# Patient Record
Sex: Female | Born: 1975 | Race: Black or African American | Hispanic: No | Marital: Married | Smoking: Never smoker
Health system: Southern US, Community
[De-identification: ages and names within clinical notes are randomized; demographics above are authoritative.]

## PROBLEM LIST (undated history)

## (undated) ENCOUNTER — Inpatient Hospital Stay (HOSPITAL_COMMUNITY): Payer: Self-pay

## (undated) DIAGNOSIS — O10919 Unspecified pre-existing hypertension complicating pregnancy, unspecified trimester: Secondary | ICD-10-CM

## (undated) DIAGNOSIS — G35 Multiple sclerosis: Secondary | ICD-10-CM

## (undated) DIAGNOSIS — I10 Essential (primary) hypertension: Secondary | ICD-10-CM

## (undated) DIAGNOSIS — E669 Obesity, unspecified: Secondary | ICD-10-CM

## (undated) HISTORY — DX: Multiple sclerosis: G35

## (undated) HISTORY — DX: Unspecified pre-existing hypertension complicating pregnancy, unspecified trimester: O10.919

## (undated) HISTORY — PX: OTHER SURGICAL HISTORY: SHX169

## (undated) HISTORY — PX: DILATION AND CURETTAGE OF UTERUS: SHX78

## (undated) HISTORY — PX: SALPINGECTOMY: SHX328

## (undated) HISTORY — DX: Obesity, unspecified: E66.9

## (undated) HISTORY — PX: CHOLECYSTECTOMY: SHX55

## (undated) HISTORY — DX: Essential (primary) hypertension: I10

---

## 2001-07-12 ENCOUNTER — Other Ambulatory Visit: Admission: RE | Admit: 2001-07-12 | Discharge: 2001-07-12 | Payer: Self-pay | Admitting: *Deleted

## 2002-04-07 ENCOUNTER — Emergency Department (HOSPITAL_COMMUNITY): Admission: EM | Admit: 2002-04-07 | Discharge: 2002-04-07 | Payer: Self-pay | Admitting: Emergency Medicine

## 2002-05-27 ENCOUNTER — Inpatient Hospital Stay (HOSPITAL_COMMUNITY): Admission: AD | Admit: 2002-05-27 | Discharge: 2002-05-27 | Payer: Self-pay | Admitting: *Deleted

## 2002-05-27 ENCOUNTER — Encounter: Payer: Self-pay | Admitting: *Deleted

## 2002-06-02 ENCOUNTER — Encounter: Payer: Self-pay | Admitting: *Deleted

## 2002-06-02 ENCOUNTER — Encounter (INDEPENDENT_AMBULATORY_CARE_PROVIDER_SITE_OTHER): Payer: Self-pay

## 2002-06-02 ENCOUNTER — Ambulatory Visit (HOSPITAL_COMMUNITY): Admission: RE | Admit: 2002-06-02 | Discharge: 2002-06-02 | Payer: Self-pay | Admitting: *Deleted

## 2002-11-02 ENCOUNTER — Other Ambulatory Visit: Admission: RE | Admit: 2002-11-02 | Discharge: 2002-11-02 | Payer: Self-pay | Admitting: *Deleted

## 2003-04-23 ENCOUNTER — Inpatient Hospital Stay (HOSPITAL_COMMUNITY): Admission: AD | Admit: 2003-04-23 | Discharge: 2003-04-23 | Payer: Self-pay | Admitting: *Deleted

## 2003-05-08 ENCOUNTER — Encounter: Admission: RE | Admit: 2003-05-08 | Discharge: 2003-05-08 | Payer: Self-pay | Admitting: *Deleted

## 2003-05-15 ENCOUNTER — Encounter: Admission: RE | Admit: 2003-05-15 | Discharge: 2003-05-15 | Payer: Self-pay | Admitting: *Deleted

## 2003-05-18 ENCOUNTER — Inpatient Hospital Stay (HOSPITAL_COMMUNITY): Admission: AD | Admit: 2003-05-18 | Discharge: 2003-05-18 | Payer: Self-pay | Admitting: *Deleted

## 2003-05-22 ENCOUNTER — Encounter: Admission: RE | Admit: 2003-05-22 | Discharge: 2003-05-22 | Payer: Self-pay | Admitting: *Deleted

## 2003-05-28 ENCOUNTER — Encounter: Admission: RE | Admit: 2003-05-28 | Discharge: 2003-05-28 | Payer: Self-pay | Admitting: *Deleted

## 2003-05-31 ENCOUNTER — Inpatient Hospital Stay (HOSPITAL_COMMUNITY): Admission: AD | Admit: 2003-05-31 | Discharge: 2003-05-31 | Payer: Self-pay | Admitting: *Deleted

## 2003-05-31 ENCOUNTER — Inpatient Hospital Stay (HOSPITAL_COMMUNITY): Admission: AD | Admit: 2003-05-31 | Discharge: 2003-06-03 | Payer: Self-pay | Admitting: *Deleted

## 2003-07-01 ENCOUNTER — Emergency Department (HOSPITAL_COMMUNITY): Admission: EM | Admit: 2003-07-01 | Discharge: 2003-07-01 | Payer: Self-pay | Admitting: Emergency Medicine

## 2003-07-15 ENCOUNTER — Encounter (INDEPENDENT_AMBULATORY_CARE_PROVIDER_SITE_OTHER): Payer: Self-pay | Admitting: *Deleted

## 2003-07-22 ENCOUNTER — Emergency Department (HOSPITAL_COMMUNITY): Admission: EM | Admit: 2003-07-22 | Discharge: 2003-07-22 | Payer: Self-pay | Admitting: Emergency Medicine

## 2003-07-22 ENCOUNTER — Encounter: Payer: Self-pay | Admitting: *Deleted

## 2003-07-25 ENCOUNTER — Encounter: Admission: RE | Admit: 2003-07-25 | Discharge: 2003-07-25 | Payer: Self-pay | Admitting: Family Medicine

## 2003-08-13 ENCOUNTER — Encounter (INDEPENDENT_AMBULATORY_CARE_PROVIDER_SITE_OTHER): Payer: Self-pay | Admitting: *Deleted

## 2003-08-13 ENCOUNTER — Encounter: Payer: Self-pay | Admitting: Surgery

## 2003-08-13 ENCOUNTER — Inpatient Hospital Stay (HOSPITAL_COMMUNITY): Admission: EM | Admit: 2003-08-13 | Discharge: 2003-08-15 | Payer: Self-pay | Admitting: Emergency Medicine

## 2003-08-14 ENCOUNTER — Encounter: Payer: Self-pay | Admitting: Surgery

## 2003-11-19 ENCOUNTER — Ambulatory Visit (HOSPITAL_COMMUNITY): Admission: RE | Admit: 2003-11-19 | Discharge: 2003-11-19 | Payer: Self-pay | Admitting: Obstetrics & Gynecology

## 2003-11-22 ENCOUNTER — Encounter: Admission: RE | Admit: 2003-11-22 | Discharge: 2003-11-22 | Payer: Self-pay | Admitting: Family Medicine

## 2004-02-04 ENCOUNTER — Ambulatory Visit (HOSPITAL_COMMUNITY): Admission: RE | Admit: 2004-02-04 | Discharge: 2004-02-04 | Payer: Self-pay | Admitting: Family Medicine

## 2004-02-04 ENCOUNTER — Encounter: Admission: RE | Admit: 2004-02-04 | Discharge: 2004-02-04 | Payer: Self-pay | Admitting: Family Medicine

## 2004-02-18 ENCOUNTER — Encounter: Admission: RE | Admit: 2004-02-18 | Discharge: 2004-02-18 | Payer: Self-pay | Admitting: Family Medicine

## 2004-03-17 ENCOUNTER — Encounter: Admission: RE | Admit: 2004-03-17 | Discharge: 2004-03-17 | Payer: Self-pay | Admitting: Family Medicine

## 2004-07-22 ENCOUNTER — Other Ambulatory Visit: Payer: Self-pay

## 2004-10-02 ENCOUNTER — Inpatient Hospital Stay (HOSPITAL_COMMUNITY): Admission: AD | Admit: 2004-10-02 | Discharge: 2004-10-04 | Payer: Self-pay | Admitting: Obstetrics

## 2005-03-05 ENCOUNTER — Inpatient Hospital Stay (HOSPITAL_COMMUNITY): Admission: AD | Admit: 2005-03-05 | Discharge: 2005-03-20 | Payer: Self-pay | Admitting: Obstetrics

## 2005-03-26 ENCOUNTER — Ambulatory Visit (HOSPITAL_COMMUNITY): Admission: RE | Admit: 2005-03-26 | Discharge: 2005-03-26 | Payer: Self-pay | Admitting: Obstetrics & Gynecology

## 2005-03-30 ENCOUNTER — Ambulatory Visit (HOSPITAL_COMMUNITY): Admission: RE | Admit: 2005-03-30 | Discharge: 2005-03-30 | Payer: Self-pay | Admitting: Obstetrics & Gynecology

## 2005-03-31 ENCOUNTER — Inpatient Hospital Stay (HOSPITAL_COMMUNITY): Admission: AD | Admit: 2005-03-31 | Discharge: 2005-04-06 | Payer: Self-pay | Admitting: Obstetrics

## 2005-04-02 ENCOUNTER — Encounter (INDEPENDENT_AMBULATORY_CARE_PROVIDER_SITE_OTHER): Payer: Self-pay | Admitting: *Deleted

## 2005-09-16 ENCOUNTER — Ambulatory Visit: Payer: Self-pay | Admitting: Family Medicine

## 2005-10-16 ENCOUNTER — Ambulatory Visit: Payer: Self-pay | Admitting: Family Medicine

## 2005-11-09 ENCOUNTER — Ambulatory Visit: Payer: Self-pay | Admitting: Family Medicine

## 2005-11-13 ENCOUNTER — Ambulatory Visit: Payer: Self-pay | Admitting: Family Medicine

## 2005-12-17 ENCOUNTER — Ambulatory Visit: Payer: Self-pay | Admitting: Family Medicine

## 2005-12-22 ENCOUNTER — Ambulatory Visit: Payer: Self-pay | Admitting: Family Medicine

## 2006-01-19 ENCOUNTER — Ambulatory Visit: Payer: Self-pay | Admitting: Family Medicine

## 2006-02-16 ENCOUNTER — Ambulatory Visit: Payer: Self-pay | Admitting: Family Medicine

## 2006-03-30 ENCOUNTER — Ambulatory Visit: Payer: Self-pay | Admitting: Family Medicine

## 2006-08-04 ENCOUNTER — Ambulatory Visit: Payer: Self-pay | Admitting: Family Medicine

## 2006-09-03 ENCOUNTER — Ambulatory Visit: Payer: Self-pay | Admitting: Family Medicine

## 2006-10-27 ENCOUNTER — Ambulatory Visit: Payer: Self-pay | Admitting: Family Medicine

## 2007-01-29 ENCOUNTER — Ambulatory Visit: Payer: Self-pay | Admitting: Family Medicine

## 2007-02-11 ENCOUNTER — Encounter (INDEPENDENT_AMBULATORY_CARE_PROVIDER_SITE_OTHER): Payer: Self-pay | Admitting: *Deleted

## 2007-03-10 DIAGNOSIS — I10 Essential (primary) hypertension: Secondary | ICD-10-CM | POA: Insufficient documentation

## 2007-10-15 ENCOUNTER — Ambulatory Visit: Payer: Self-pay | Admitting: Family Medicine

## 2007-10-15 ENCOUNTER — Encounter (INDEPENDENT_AMBULATORY_CARE_PROVIDER_SITE_OTHER): Payer: Self-pay | Admitting: Internal Medicine

## 2007-10-25 ENCOUNTER — Ambulatory Visit: Payer: Self-pay | Admitting: Family Medicine

## 2007-10-25 DIAGNOSIS — E669 Obesity, unspecified: Secondary | ICD-10-CM

## 2007-10-25 DIAGNOSIS — R21 Rash and other nonspecific skin eruption: Secondary | ICD-10-CM | POA: Insufficient documentation

## 2007-10-25 DIAGNOSIS — I891 Lymphangitis: Secondary | ICD-10-CM

## 2008-05-04 ENCOUNTER — Ambulatory Visit: Payer: Self-pay | Admitting: Family Medicine

## 2008-05-18 ENCOUNTER — Ambulatory Visit: Payer: Self-pay | Admitting: Family Medicine

## 2008-05-22 LAB — CONVERTED CEMR LAB
ALT: 10 units/L (ref 0–35)
Albumin: 3.9 g/dL (ref 3.5–5.2)
Alkaline Phosphatase: 62 units/L (ref 39–117)
BUN: 9 mg/dL (ref 6–23)
Bilirubin, Direct: 0.1 mg/dL (ref 0.0–0.3)
CO2: 31 meq/L (ref 19–32)
Calcium: 9.1 mg/dL (ref 8.4–10.5)
Eosinophils Relative: 1.5 % (ref 0.0–5.0)
GFR calc Af Amer: 83 mL/min
Glucose, Bld: 103 mg/dL — ABNORMAL HIGH (ref 70–99)
HCT: 36.6 % (ref 36.0–46.0)
Hemoglobin: 13 g/dL (ref 12.0–15.0)
LDL Cholesterol: 111 mg/dL — ABNORMAL HIGH (ref 0–99)
Lymphocytes Relative: 25.3 % (ref 12.0–46.0)
Monocytes Absolute: 0.4 10*3/uL (ref 0.1–1.0)
Monocytes Relative: 6.5 % (ref 3.0–12.0)
Neutro Abs: 4.3 10*3/uL (ref 1.4–7.7)
RBC: 3.84 M/uL — ABNORMAL LOW (ref 3.87–5.11)
Sodium: 137 meq/L (ref 135–145)
Total CHOL/HDL Ratio: 5
Total Protein: 7.4 g/dL (ref 6.0–8.3)
WBC: 6.4 10*3/uL (ref 4.5–10.5)

## 2008-06-20 ENCOUNTER — Telehealth (INDEPENDENT_AMBULATORY_CARE_PROVIDER_SITE_OTHER): Payer: Self-pay | Admitting: Internal Medicine

## 2008-09-11 ENCOUNTER — Encounter (INDEPENDENT_AMBULATORY_CARE_PROVIDER_SITE_OTHER): Payer: Self-pay | Admitting: *Deleted

## 2008-11-13 DIAGNOSIS — G35 Multiple sclerosis: Secondary | ICD-10-CM

## 2008-11-17 ENCOUNTER — Ambulatory Visit: Payer: Self-pay | Admitting: Internal Medicine

## 2008-11-17 DIAGNOSIS — F809 Developmental disorder of speech and language, unspecified: Secondary | ICD-10-CM

## 2008-11-18 ENCOUNTER — Encounter (INDEPENDENT_AMBULATORY_CARE_PROVIDER_SITE_OTHER): Payer: Self-pay | Admitting: Internal Medicine

## 2008-11-18 ENCOUNTER — Observation Stay (HOSPITAL_COMMUNITY): Admission: EM | Admit: 2008-11-18 | Discharge: 2008-11-19 | Payer: Self-pay | Admitting: Emergency Medicine

## 2008-11-18 HISTORY — PX: MRI: SHX5353

## 2008-11-19 ENCOUNTER — Encounter (INDEPENDENT_AMBULATORY_CARE_PROVIDER_SITE_OTHER): Payer: Self-pay | Admitting: Internal Medicine

## 2008-11-23 ENCOUNTER — Ambulatory Visit: Payer: Self-pay | Admitting: Family Medicine

## 2008-11-27 ENCOUNTER — Ambulatory Visit (HOSPITAL_COMMUNITY): Admission: RE | Admit: 2008-11-27 | Discharge: 2008-11-27 | Payer: Self-pay | Admitting: Neurology

## 2008-11-27 HISTORY — PX: OTHER SURGICAL HISTORY: SHX169

## 2008-12-04 ENCOUNTER — Encounter (INDEPENDENT_AMBULATORY_CARE_PROVIDER_SITE_OTHER): Payer: Self-pay | Admitting: Internal Medicine

## 2009-01-17 ENCOUNTER — Encounter (INDEPENDENT_AMBULATORY_CARE_PROVIDER_SITE_OTHER): Payer: Self-pay | Admitting: Internal Medicine

## 2009-03-07 ENCOUNTER — Telehealth (INDEPENDENT_AMBULATORY_CARE_PROVIDER_SITE_OTHER): Payer: Self-pay | Admitting: Internal Medicine

## 2009-05-07 ENCOUNTER — Ambulatory Visit: Payer: Self-pay | Admitting: Internal Medicine

## 2009-05-15 ENCOUNTER — Telehealth (INDEPENDENT_AMBULATORY_CARE_PROVIDER_SITE_OTHER): Payer: Self-pay | Admitting: Internal Medicine

## 2009-05-17 ENCOUNTER — Encounter: Payer: Self-pay | Admitting: Internal Medicine

## 2009-05-17 ENCOUNTER — Ambulatory Visit: Payer: Self-pay | Admitting: Internal Medicine

## 2009-05-17 DIAGNOSIS — E049 Nontoxic goiter, unspecified: Secondary | ICD-10-CM | POA: Insufficient documentation

## 2009-05-17 DIAGNOSIS — R002 Palpitations: Secondary | ICD-10-CM

## 2009-05-17 DIAGNOSIS — R079 Chest pain, unspecified: Secondary | ICD-10-CM

## 2009-05-21 ENCOUNTER — Encounter: Admission: RE | Admit: 2009-05-21 | Discharge: 2009-05-21 | Payer: Self-pay | Admitting: Internal Medicine

## 2009-05-22 LAB — CONVERTED CEMR LAB
Free T4: 0.83 ng/dL (ref 0.80–1.80)
TSH: 1.918 microintl units/mL (ref 0.350–4.500)

## 2009-05-30 ENCOUNTER — Ambulatory Visit: Payer: Self-pay

## 2009-05-30 ENCOUNTER — Ambulatory Visit: Payer: Self-pay | Admitting: Internal Medicine

## 2009-05-30 ENCOUNTER — Encounter: Payer: Self-pay | Admitting: Internal Medicine

## 2009-06-06 ENCOUNTER — Ambulatory Visit: Payer: Self-pay | Admitting: Family Medicine

## 2009-06-06 DIAGNOSIS — J029 Acute pharyngitis, unspecified: Secondary | ICD-10-CM

## 2009-06-07 ENCOUNTER — Telehealth: Payer: Self-pay | Admitting: Internal Medicine

## 2009-06-10 ENCOUNTER — Ambulatory Visit: Payer: Self-pay | Admitting: Family Medicine

## 2009-06-12 ENCOUNTER — Encounter: Payer: Self-pay | Admitting: Internal Medicine

## 2009-07-03 ENCOUNTER — Encounter (INDEPENDENT_AMBULATORY_CARE_PROVIDER_SITE_OTHER): Payer: Self-pay | Admitting: Internal Medicine

## 2009-12-17 ENCOUNTER — Telehealth (INDEPENDENT_AMBULATORY_CARE_PROVIDER_SITE_OTHER): Payer: Self-pay | Admitting: Internal Medicine

## 2009-12-18 ENCOUNTER — Encounter (INDEPENDENT_AMBULATORY_CARE_PROVIDER_SITE_OTHER): Payer: Self-pay | Admitting: Internal Medicine

## 2009-12-18 ENCOUNTER — Ambulatory Visit: Payer: Self-pay | Admitting: Family Medicine

## 2009-12-18 DIAGNOSIS — R071 Chest pain on breathing: Secondary | ICD-10-CM

## 2010-03-18 LAB — CONVERTED CEMR LAB: Pap Smear: NORMAL

## 2010-03-20 ENCOUNTER — Encounter: Payer: Self-pay | Admitting: Family Medicine

## 2010-03-24 ENCOUNTER — Ambulatory Visit: Payer: Self-pay | Admitting: Family Medicine

## 2010-03-24 ENCOUNTER — Telehealth: Payer: Self-pay | Admitting: Family Medicine

## 2010-03-24 DIAGNOSIS — R5381 Other malaise: Secondary | ICD-10-CM

## 2010-03-24 DIAGNOSIS — R5383 Other fatigue: Secondary | ICD-10-CM

## 2010-03-24 DIAGNOSIS — M25579 Pain in unspecified ankle and joints of unspecified foot: Secondary | ICD-10-CM

## 2010-03-24 LAB — CONVERTED CEMR LAB
ALT: 14 units/L (ref 0–35)
Albumin: 3.9 g/dL (ref 3.5–5.2)
Basophils Absolute: 0 10*3/uL (ref 0.0–0.1)
Basophils Relative: 0.3 % (ref 0.0–3.0)
Bilirubin, Direct: 0.1 mg/dL (ref 0.0–0.3)
CO2: 28 meq/L (ref 19–32)
Chloride: 106 meq/L (ref 96–112)
Cholesterol: 167 mg/dL (ref 0–200)
Eosinophils Absolute: 0.1 10*3/uL (ref 0.0–0.7)
Glucose, Bld: 109 mg/dL — ABNORMAL HIGH (ref 70–99)
Hemoglobin: 12.8 g/dL (ref 12.0–15.0)
Lymphs Abs: 1.7 10*3/uL (ref 0.7–4.0)
MCHC: 34.8 g/dL (ref 30.0–36.0)
MCV: 97 fL (ref 78.0–100.0)
Monocytes Absolute: 0.5 10*3/uL (ref 0.1–1.0)
Neutro Abs: 5.7 10*3/uL (ref 1.4–7.7)
RBC: 3.77 M/uL — ABNORMAL LOW (ref 3.87–5.11)
RDW: 12.3 % (ref 11.5–14.6)
Sodium: 141 meq/L (ref 135–145)
Total CHOL/HDL Ratio: 5
Total Protein: 7.4 g/dL (ref 6.0–8.3)
Triglycerides: 65 mg/dL (ref 0.0–149.0)

## 2010-03-27 ENCOUNTER — Encounter: Admission: RE | Admit: 2010-03-27 | Discharge: 2010-03-27 | Payer: Self-pay | Admitting: Family Medicine

## 2010-03-27 ENCOUNTER — Telehealth: Payer: Self-pay | Admitting: Family Medicine

## 2010-03-30 ENCOUNTER — Emergency Department (HOSPITAL_COMMUNITY): Admission: EM | Admit: 2010-03-30 | Discharge: 2010-03-30 | Payer: Self-pay | Admitting: Emergency Medicine

## 2010-05-26 ENCOUNTER — Ambulatory Visit: Payer: Self-pay | Admitting: Family Medicine

## 2010-05-26 DIAGNOSIS — J069 Acute upper respiratory infection, unspecified: Secondary | ICD-10-CM | POA: Insufficient documentation

## 2010-05-26 DIAGNOSIS — R300 Dysuria: Secondary | ICD-10-CM

## 2010-05-26 LAB — CONVERTED CEMR LAB
Blood in Urine, dipstick: NEGATIVE
Ketones, urine, test strip: NEGATIVE
Nitrite: NEGATIVE
Specific Gravity, Urine: <1.005

## 2010-05-27 ENCOUNTER — Encounter: Payer: Self-pay | Admitting: Family Medicine

## 2010-11-10 ENCOUNTER — Ambulatory Visit (HOSPITAL_COMMUNITY)
Admission: RE | Admit: 2010-11-10 | Discharge: 2010-11-10 | Payer: Self-pay | Source: Home / Self Care | Admitting: Obstetrics

## 2010-11-21 ENCOUNTER — Ambulatory Visit (HOSPITAL_COMMUNITY)
Admission: AD | Admit: 2010-11-21 | Discharge: 2010-11-22 | Payer: Self-pay | Source: Home / Self Care | Attending: Obstetrics & Gynecology | Admitting: Obstetrics & Gynecology

## 2010-11-22 ENCOUNTER — Encounter: Payer: Self-pay | Admitting: Obstetrics & Gynecology

## 2011-01-13 NOTE — Progress Notes (Signed)
Summary: ankle pain  Phone Note Call from Patient Call back at Work Phone [phone] Call back at [phone]   Caller: Patient Call For: Dr Dayton Martes Summary of Call: Pt was seen today, mentioned a problem with her ankle, but it was ok today.  Now ankle is swollen and hurting.  She has had a problem with this off and for 2 weeks.  Taking advil - two at a time as needed, usually once a day a few times a week.  Using ice, wrapping it when it swells. Anything else she can do?  She doesnt want to come back in, if she can help it, since she was just here today. Initial call taken by: Lowella Petties CMA,  March 24, 2010 4:12 PM  Follow-up for Phone Call        If she does not want want to come in, she can take Ibuprofen 800mg  three times a day.  We can send her for xray if she is interested. Follow-up by: Ruthe Mannan MD,  March 24, 2010 4:32 PM  Additional Follow-up for Phone Call Additional follow up Details #1::        Patient Advised.   Patient is willing to do x-ray. Delilah Shan CMA (AAMA)  March 24, 2010 4:44 PM   New Problems: ANKLE PAIN, RIGHT (ICD-719.47)   New Problems: ANKLE PAIN, RIGHT (ICD-719.47)

## 2011-01-13 NOTE — Progress Notes (Signed)
Summary: pt doesnt want ortho referral  Phone Note Call from Patient   Caller: Patient Summary of Call: Pt was to have been referred to [facility] for ankle pain but she says not to worry about it.  She says she will figure something out on her own.  She says she cant keep taking off time from work  to go to doctors appts.  I offered her appt here with Dr. Patsy Lager but she declined, she said she will call back if she changes her mind. Initial call taken by: Lowella Petties CMA,  March 27, 2010 12:22 PM

## 2011-01-13 NOTE — Letter (Signed)
Summary: [facility]  [facility]   Imported By: Lanelle Bal 03/25/2010 13:53:54  _____________________________________________________________________  External Attachment:    Type:   Image     Comment:   External Document

## 2011-01-13 NOTE — Progress Notes (Signed)
Summary: Deep breath  Phone Note Call from Patient Call back at Home Phone [phone]   Caller: Patient Call For: Gildardo Griffes FNP Summary of Call: pt states she has been having trouble since Dec with taking deep breaths, she not having SOB but just when she takes a deep breath she is feeling discomfort. I scheduled pt appt tomorrow at 8:30am with you. I also advised pt that if she continues she should go to the ER. Initial call taken by: Mervin Hack CMA Duncan Dull),  December 17, 2009 9:46 AM  Follow-up for Phone Call        noted  Billie-Lynn Tyler Deis FNP  December 17, 2009 9:47 AM

## 2011-01-13 NOTE — Assessment & Plan Note (Signed)
Summary: CAN'T TAKE DEEP BREATH X1MTH /DS   Vital Signs:  Patient profile:   35 year old female Height:      64 inches Weight:      205.75 pounds BMI:     35.44 O2 Sat:      99 % on Room air Temp:     99 degrees F oral Pulse rate:   64 / minute Pulse rhythm:   regular Resp:     20 per minute BP sitting:   122 / 80  (left arm) Cuff size:   large  Vitals Entered By: Lewanda Rife LPN (December 18, 2009 8:51 AM)  O2 Flow:  Room air  Primary Care Provider:  Mcarthur Rossetti Timmy Cleverly FNP  CC:  can't take deep breath for one month.  History of Present Illness: Here as not able to get a deep breath x1 and 1/2 mo--called in yesterday afternoon ----has intermittent pain in L lower chest with deep breath or yawn --noted after helping husband with sling post op--he is tall, she had to stretch to get sling on --IBP 400 and Aleve 2 dont help  --no hx of difficulty breathing, has early MS  Allergies (verified): No Known Drug Allergies  Review of Systems      See HPI CV:  Complains of chest pain or discomfort; denies palpitations, swelling of feet, and swelling of hands. Resp:  See HPI; Complains of chest pain with inspiration; denies cough and wheezing. GI:  Denies nausea and vomiting. MS:  See HPI. Psych:  Complains of anxiety; denies depression; with current episode .  Physical Exam  General:  alert, well-developed, well-nourished, and well-hydrated.  NAD Neck:  no JVD and no carotid bruits.   Lungs:  normal respiratory effort, no intercostal retractions, no accessory muscle use, normal breath sounds, no crackles, and no wheezes.   Heart:  normal rate, regular rhythm, and no murmur.   Msk:  tender to light palp Lanterior chest wall, not over costal joints, no edema or eryth, increased discomfort with raising L arm against resistance Neurologic:  alert & oriented X3 and sensation intact to light touch.  gait slow initially, then pace increased, able to get on and off exam table  without assistance Skin:  see M/S Psych:  normally interactive, subdued, and slightly anxious.     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 One by mouth daily    Micardis Hct 80-25 Mg Tabs (Telmisartan-hctz) .Marland Kitchen... 1 once daily for bp  BP today: 122/80 Prior BP: 110/80 (06/10/2009)  Labs Reviewed: K+: 3.7 (05/18/2008) Creat: : 1.0 (05/18/2008)   Chol: 152 (05/18/2008)   HDL: 30.7 (05/18/2008)   LDL: 111 (05/18/2008)   TG: 52 (05/18/2008)  Complete Medication List: 1)  Norvasc 5 Mg Tabs (Amlodipine besylate) .... One by mouth daily 2)  Micardis Hct 80-25 Mg Tabs (Telmisartan-hctz) .Marland Kitchen.. 1 once daily for bp 3)  Avonex Prefilled 30 Mcg/0.46ml Kit (Interferon beta-1a) .... Injection once weekly for ms Prescriptions: NORVASC 5 MG  TABS (AMLODIPINE BESYLATE) one by mouth daily  #30 Tablet x 1   Entered by:   Lewanda Rife LPN   Authorized by:   Gildardo Griffes FNP   Signed by:   Lewanda Rife LPN on 16/09/9603   Method used:   Electronically to        [facility] [phone]* (retail)       [address]       [place], [place]  [address]       Ph: [phone]       Fax: [phone]   RxID:   [id number]   Current Allergies (reviewed today): No known allergies

## 2011-01-13 NOTE — Assessment & Plan Note (Signed)
Summary: follow up  billies patient/rbh   Vital Signs:  Patient profile:   35 year old female Height:      64 inches Weight:      207.38 pounds BMI:     35.73 Temp:     98.7 degrees F oral Pulse rate:   80 / minute Pulse rhythm:   regular BP sitting:   122 / 84  (left arm) Cuff size:   large  Vitals Entered By: Delilah Shan CMA Duncan Dull) (March 24, 2010 8:25 AM) CC: follow-up visit   History of Present Illness: 35 yo here to establish care with me (BDB patient).  h/o MS- diagnosed over a  year ago.  Has been taking Avonex weekly, which has greatly improved her symptoms.  Was having slurred speech, LE weakness and difficulty writing.  All that has resolved since she started taking it.  HTN- labile.  Well controlled now and Norvasc 5 mg daily and Micardis HCTZ 80-25.  PMH reviewed, appears she has been on numerous antihypertensives in past.  Had difficulty with both pregnancy because of labile HTN.  Contraception managment- currently has MIrena IUD but really wants to start trying to get pregnant sometime next year.  She is a Dentist.  Wants to loose 30 pounds before she gets pregant.  Recently started walking during her lunch hour.  Current Medications (verified): 1)  Norvasc 5 Mg Tabs (Amlodipine Besylate) .... One By Mouth Daily 2)  Micardis Hct 80-25 Mg Tabs (Telmisartan-Hctz) .Marland Kitchen.. 1 Once Daily For Bp 3)  Avonex Prefilled 30 Mcg/0.35ml Kit (Interferon Beta-1a) .... Injection Once Weekly For Ms  Allergies (verified): No Known Drug Allergies  Review of Systems      See HPI General:  Complains of fatigue; denies sweats, weakness, and weight loss. Eyes:  Denies blurring. ENT:  Denies difficulty swallowing. CV:  Denies chest pain or discomfort and shortness of breath with exertion. Resp:  Denies shortness of breath. GI:  Denies abdominal pain, bloody stools, and change in bowel habits. GU:  Denies dysuria. MS:  Denies joint pain, joint redness, and joint swelling. Derm:  Denies  rash. Neuro:  Denies headaches. Psych:  Denies anxiety and depression. Endo:  Denies cold intolerance.  Physical Exam  General:  alert, well-developed, well-nourished, and well-hydrated.  NAD Eyes:  No corneal or conjunctival inflammation noted. EOMI. Perrla. Funduscopic exam benign, without hemorrhages, exudates or papilledema. Vision grossly normal. Mouth:  Oral mucosa and oropharynx without lesions or exudates.  Teeth in good repair. Neck:  no JVD and no carotid bruits.   full thyroid, non tender. Lungs:  normal respiratory effort, no intercostal retractions, no accessory muscle use, normal breath sounds, no crackles, and no wheezes.   Heart:  normal rate, regular rhythm, and no murmur.   Abdomen:  Bowel sounds positive,abdomen soft and non-tender without masses, organomegaly or hernias noted. Extremities:  no edema either lower legs Neurologic:  alert & oriented X3 and strength normal in all extremities.   Psych:  normally interactive, subdued, and slightly anxious.     Impression & Recommendations:  Problem # 1:  GOITER, UNSPECIFIED (ICD-240.9) Assessment New Will check TSH today.  Problem # 2:  HYPERTENSION (ICD-401.9) Assessment: Unchanged Currently stable.  Discussed importance of changing classes of antihypertensive when she decides to start trying to get pregnant.  She will call me when she decides to have her IUD taken out and we will readdress at that time.  ?possible labetolol Her updated medication list for this problem includes:  Norvasc 5 Mg Tabs (Amlodipine besylate) ..... One by mouth daily    Micardis Hct 80-25 Mg Tabs (Telmisartan-hctz) .Marland Kitchen... 1 once daily for bp  Orders: Venipuncture (50093) TLB-BMP (Basic Metabolic Panel-BMET) (80048-METABOL)  Problem # 3:  FATIGUE (ICD-780.79) Assessment: New Likely due to lifestyle, full time job with 2 small children.  But given her goiter, wil check TSH, BMET and CBC. Orders: TLB-CBC Platelet - w/Differential  (85025-CBCD) TLB-TSH (Thyroid Stimulating Hormone) (84443-TSH)  Complete Medication List: 1)  Norvasc 5 Mg Tabs (Amlodipine besylate) .... One by mouth daily 2)  Micardis Hct 80-25 Mg Tabs (Telmisartan-hctz) .Marland Kitchen.. 1 once daily for bp 3)  Avonex Prefilled 30 Mcg/0.67ml Kit (Interferon beta-1a) .... Injection once weekly for ms  Other Orders: TLB-Hepatic/Liver Function Pnl (80076-HEPATIC) TLB-Lipid Panel (80061-LIPID)  Patient Instructions: 1)  It was so nice to meet you, Nesha. 2)  Good luck!!! 3)  Let me know when you start trying. 4)  We will call you with your lab results in the next day or two.  Current Allergies (reviewed today): No known allergies   Last PAP:  Done. (07/15/2003 12:00:00 AM) PAP Result Date:  03/18/2010 PAP Result:  normal PAP Next Due:  2 yr

## 2011-01-13 NOTE — Assessment & Plan Note (Signed)
Summary: URI/ 11:30   Vital Signs:  Patient profile:   35 year old female Height:      64 inches Weight:      206 pounds BMI:     35.49 Temp:     98.5 degrees F oral Pulse rate:   64 / minute Pulse rhythm:   regular BP sitting:   140 / 90  (left arm) Cuff size:   regular  Vitals Entered By: Linde Gillis CMA Duncan Dull) (May 26, 2010 11:47 AM) CC: ? URI, and ?UTI   History of Present Illness: 35 yo here for ?URI and ?UTI.  URI- started having cold symptoms last week.  Runny nose, cough, sore throat.  Cough has become productive over past few days.  No wheezing, SOB, CP or other symptoms.  Trying Delsym with no improvement of symptoms.  Feels like sinus pressure has worsened instead of getting better.  UTI- last two days, incrased urinary frequency, dysuria. No nausea, vomiting, fever, chills or back pain.  Current Medications (verified): 1)  Norvasc 5 Mg Tabs (Amlodipine Besylate) .... One By Mouth Daily 2)  Micardis Hct 80-25 Mg Tabs (Telmisartan-Hctz) .Marland Kitchen.. 1 Once Daily For Bp 3)  Avonex Prefilled 30 Mcg/0.93ml Kit (Interferon Beta-1a) .... Injection Once Weekly For Ms 4)  Bactrim Ds 800-160 Mg Tabs (Sulfamethoxazole-Trimethoprim) .Marland Kitchen.. 1 Tab By Mouth Two Times A Day X 5 Days  Allergies (verified): No Known Drug Allergies  Review of Systems      See HPI General:  Denies chills and fever. Resp:  Denies shortness of breath and wheezing. GI:  Denies abdominal pain, nausea, and vomiting.  Physical Exam  General:  alert, well-developed, well-nourished, and well-hydrated.  NAD Ears:  R ear normal and L ear normal.   Nose:  mucosal erythema.   Mouth:  Oral mucosa and oropharynx without lesions or exudates.  Teeth in good repair. Lungs:  normal respiratory effort, no intercostal retractions, no accessory muscle use, normal breath sounds, no crackles, and no wheezes.   Heart:  normal rate, regular rhythm, and no murmur.   Abdomen:  Bowel sounds positive,abdomen soft and non-tender  without masses, organomegaly or hernias noted. NO CVA tenderness Psych:  normally interactive, subdued, and slightly anxious.     Impression & Recommendations:  Problem # 1:  URI (ICD-465.9) Assessment New Likely viral.  Continue supportive care.  See pt instructions for details.  Also will be taking Bactrim for UTI, which has coverage for URI flora even  if it was bacterial.  Problem # 2:  DYSURIA (ICD-788.1) Assessment: New UA consistent with UTI.  Will treat with 5 day course of Bactrim, uncomplicated. Her updated medication list for this problem includes:    Bactrim Ds 800-160 Mg Tabs (Sulfamethoxazole-trimethoprim) .Marland Kitchen... 1 tab by mouth two times a day x 5 days  Orders: T-Culture, Urine ([id number]) UA Dipstick w/o Micro (manual) (46962) Specimen Handling (99000)  Complete Medication List: 1)  Norvasc 5 Mg Tabs (Amlodipine besylate) .... One by mouth daily 2)  Micardis Hct 80-25 Mg Tabs (Telmisartan-hctz) .Marland Kitchen.. 1 once daily for bp 3)  Avonex Prefilled 30 Mcg/0.76ml Kit (Interferon beta-1a) .... Injection once weekly for ms 4)  Bactrim Ds 800-160 Mg Tabs (Sulfamethoxazole-trimethoprim) .Marland Kitchen.. 1 tab by mouth two times a day x 5 days  Patient Instructions: 1)  Acute sinusitis symptoms for less than 10 days are not helped by antibiotics. Use warm moist compresses, and over the counter decongestants( only as directed). Call if no improvement in 5-7 days, sooner  if increasing pain, fever, or new symptoms.  2)  Drink plenty of fluids up to 3-4 quarts a day. Cranberry juice is especially recommended in addition to large amounts of water. Avoid caffeine & carbonated drinks, they tend to irritate the bladder, Return in 3-5 days if you're not better: sooner if you're feeling worse.  Prescriptions: BACTRIM DS 800-160 MG TABS (SULFAMETHOXAZOLE-TRIMETHOPRIM) 1 tab by mouth two times a day x 5 days  #10 x 0   Entered and Authorized by:   Ruthe Mannan MD   Signed by:   Ruthe Mannan MD on 05/26/2010    Method used:   Electronically to        [facility] [phone]* (retail)       [address]       [place], [place]  [address]       Ph: [phone]       Fax: [phone]   RxID:   [id number]   Current Allergies (reviewed today): No known allergies   Laboratory Results   Urine Tests  Date/Time Received: May 26, 2010 12:36 PM   Routine Urinalysis   Color: lt. yellow Appearance: Clear Glucose: negative   (Normal Range: Negative) Bilirubin: negative   (Normal Range: Negative) Ketone: negative   (Normal Range: Negative) Spec. Gravity: <1.005   (Normal Range: 1.003-1.035) Blood: negative   (Normal Range: Negative) pH: 7.5   (Normal Range: 5.0-8.0) Protein: trace   (Normal Range: Negative) Urobilinogen: 0.2   (Normal Range: 0-1) Nitrite: negative   (Normal Range: Negative) Leukocyte Esterace: moderate   (Normal Range: Negative)

## 2011-02-23 LAB — URINE MICROSCOPIC-ADD ON

## 2011-02-23 LAB — URINALYSIS, ROUTINE W REFLEX MICROSCOPIC
Hgb urine dipstick: NEGATIVE
Nitrite: NEGATIVE
Specific Gravity, Urine: 1.02 (ref 1.005–1.030)
Urobilinogen, UA: 1 mg/dL (ref 0.0–1.0)
pH: 6 (ref 5.0–8.0)

## 2011-02-23 LAB — CBC
HCT: 36.4 % (ref 36.0–46.0)
Hemoglobin: 12.6 g/dL (ref 12.0–15.0)
MCH: 33 pg (ref 26.0–34.0)
MCHC: 34.5 g/dL (ref 30.0–36.0)

## 2011-02-23 LAB — ABO/RH: ABO/RH(D): B POS

## 2011-02-23 LAB — HCG, QUANTITATIVE, PREGNANCY

## 2011-04-28 NOTE — Consult Note (Signed)
Angel Phelps, Angel Phelps            ACCOUNT NO.:  [id number]   MEDICAL RECORD NO.:  [id number]          PATIENT TYPE:  INP   LOCATION:  1407                         FACILITY:  [facility]   PHYSICIAN:  Casimiro Needle L. Reynolds, M.D.DATE OF BIRTH:  [birth year]   DATE OF CONSULTATION:  11/19/2008  DATE OF DISCHARGE:                                 CONSULTATION   CHIEF COMPLAINT:  Episodes of slurred speech.   HISTORY OF PRESENT ILLNESS:  This 35 year old African American female  with a two day history of intermittent slurred speech, oral numbness,  and left leg heaviness.  The patient states that these episodes occur  from seconds to minutes and have occurred multiple times.  The patient  first noticed these symptoms on Thursday, 12/03, when she was at work  and she was just finishing her lunch when she noted a tugging sensation  on the left perioral region along with a tingly sensation that did not  spread, stayed focally at the left perioral region.  The patient states  that occurred for approximately 20-30 minutes and then dissipated.  Approximately 3 hours later during the day she noticed that on the right  aspect of her mouth she had the same tingling sensation; however, this  time she noticed that she was talking to a fellow coworker and her  speech did not seem correct.  She described her  speech as slurred and  unable to articulate her words fully.  The patient went home that night  and had no other symptoms.  Friday morning woke up and noticed that her  left leg had become slightly heavy and achy in sensation for  approximately 20-30 seconds and then dissipated.  Throughout the day  that day she noticed that she again was having slurred and abnormal  enunciation of her speech which her coworkers objectively noted.  These  episodes occurred for seconds to minutes and then dissipated.  The  patient states there was no prodrome or aura that would have warned her  that it was occurring.  That  night the patient went home and had one  episode of numbness in the right aspect of the mouth that lasted seconds  and went to sleep.  Saturday morning woke up and noticed again some left  numbness and heaviness in her leg along with slurred speech while she  was saying prayer with her husband.  Her husband noticed the abnormality  in speech, became very concerned, and took her to the [facility] ED.  While in hospital the patient had an MRI which did show multiple white  matter lesions with pattern characteristic of multiple sclerosis.  Throughout the day on Sunday while she is in hospital, the patient kept  a log of how many times these episodes occurred.  She noted that at 9  a.m. she had a left lower leg heaviness and left foot numbness.  At 3:12  she again had symptoms.  At 3:56 again had symptoms.  At 4:20 p.m. noted  another episode of symptoms.  She states that during these episodes she  did have family members and friends in  the room that objectively noticed  the slurring of speech.  However, she stated there was no drooping or  abnormalities of her facial expression or musculature.  The patient  denies any visual disturbance or any decrease in or abnormal colors or  inability to discern colors.  No swallowing problems.  No bowel or  bladder problems.  No drooling.  She was able to [facility] and feed  herself without any difficulty.  She stated that she did not notice any  weakness or inability to walk, that her gait had never changed.  No  abnormalities in her writing or ability to read.   PAST MEDICAL HISTORY:  Positive for hypertension.   MEDICATIONS:  1. Amlodipine 5 mg daily.  2. Benicar 40 mg daily.  3. Hydrochlorothiazide 25 mg daily.  4. Aspirin 325 daily.   ALLERGIES:  None.   FAMILY HISTORY:  Mother died at age 34 of a heart attack.  Father has  heart disease and has had a coronary artery bypass.  He is 54 at this  time.  Father has also had hyperlipidemia and  diabetes.   SOCIAL HISTORY:  She does not smoke and does not drink.  Does not do  illicit drugs.  She is married and works a Ambulance person as an  Production designer, theatre/television/film.   REVIEW OF SYSTEMS:  With the exception of above, she is negative for all  12 review of systems.   PHYSICAL EXAM:  VITAL SIGNS:  Blood pressure 125/76.  Pulse 80,  respiratory rate 19, and temperature 97.8.  NEURO:  Mental status:  She is alert and oriented x3.  Carries out two  steps.  Has good enunciation, good sentence structure, and she is able  to communicate without any difficulties.  Pupils are equal, round, and  reactive to light, accommodating 3 mm and 2 mm bilaterally.  Conjugate  gaze.  Extraocular muscles are intact.  Visual fields intact.  Face  symmetrical.  Tongue midline.  Uvula midline.  No slurred speech or  facial droop.  Coordination, finger-to-nose and heel-to-shin within  normal limits.  Fine motor movements are within normal limits.  Gait is  narrow, smooth, and nonataxic.  Motor is 5/5 globally.  No tremor.  Good  bulk, good tone.  Deep tendon reflexes are 2+ globally in the upper extremities.  Patella  is 3+ bilaterally, and plantar reflex is downgoing bilaterally.  Drift:  She has negative drift bilaterally upper and lower extremities.  Sensation is full to pinprick and light touch.  PULMONARY:  Clear to auscultation bilaterally.  CARDIOVASCULAR:  S1, S2.  Regular rate and rhythm without murmurs,  gallops or rubs.  ABDOMEN:  Normal bowel sounds, soft, nontender, nondistended.  GU AND RECTAL:  Deferred.  EXTREMITIES:  No clubbing, cyanosis or edema.  NECK:  Negative bruits and is supple.  PSYCHIATRIC:  Normal affect.  Calm and cooperative.   LABS:  At this time, homocysteine 10.7.  UA is positive for leukocytes  and few bacteria along with a few epithelial cells.  Sodium 136,  potassium 3.1, chloride 100, bicarb 29, BUN 9, creatinine is 0.74,  glucose 110, white blood cells 9.5.  Hemoglobin and  hematocrit 13 and  39.2.  Platelets 367, triglycerides 67, cholesterol 160, HDL 28, LDL  119.  Urine drug screen is negative.   IMAGING AND TESTS:  CT of the head was negative.  MRI showed multiple  white matter lesions with pattern characteristic of MS.  MRA of the head  was negative.   ASSESSMENT:  Assessment at this time is a 35 year old African American  female with new onset perioral tingling, abnormal sensation and  heaviness of her left leg.  The symptoms are intermittent and quick in  nature.  MRI shows white matter lesions characteristic of multiple  sclerosis.  With signs and symptoms and MRI findings, the  patient has a high probability diagnostic of multiple sclerosis.  Treatment at this time can be carried out as an outpatient with steroids  and disease modifying agents.  I will discuss this with Dr. Thad Ranger.  Thank you very much for the consultation.     ______________________________  Felicie Morn, PA-C      Marolyn Hammock. Thad Ranger, M.D.  Electronically Signed    DS/MEDQ  D:  11/19/2008  T:  11/19/2008  Job:  [id number]   cc:   Vikki Ports A. Felicity Coyer, MD  [address] [place], [place] [address]   Dr. Thad Ranger

## 2011-04-28 NOTE — H&P (Signed)
NAME:  Angel, Phelps            ACCOUNT NO.:  [id number]   MEDICAL RECORD NO.:  [id number]          PATIENT TYPE:  EMS   LOCATION:  ED                           FACILITY:  [facility]   PHYSICIAN:  Corinna L. Lendell Caprice, MDDATE OF BIRTH:  [birth year]   DATE OF ADMISSION:  11/17/2008  DATE OF DISCHARGE:                              HISTORY & PHYSICAL   CHIEF COMPLAINT:  Slurred speech.   HISTORY OF PRESENT ILLNESS:  Angel Phelps is a 35 year old black female  patient of Dr. Hetty Ely who presents with recurrent episodes of slurred  speech.  This started 2 days ago and usually lasts for a short period of  time, seconds to minutes.  But they  have been recurrent in nature.  The  patient noticed it herself several days ago and thought it might be an  allergic reaction to a paper towel she had used. Her husband noticed it  yesterday and again while on the phone several times.  The patient  reports that she also has right leg numbness. She has no other symptoms.  She has been compliant with her antihypertensives.  She has never had a  similar episode.   PAST MEDICAL HISTORY:  Hypertension.   MEDICATIONS:  Amlodipine 5 mg a day, Benicar 40/25 mg a day.   ALLERGIES:  No known drug allergies.   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  She does  not smoke, drink or use drugs.  She is married.   FAMILY HISTORY:  Her mother died at age 62 of a heart attack.  Her  father has heart disease and has had coronary artery bypass surgery.  He  is 54.  He also has hyperlipidemia and diabetes.   REVIEW OF SYSTEMS:  As above. Otherwise negative.   PHYSICAL EXAMINATION:  VITAL SIGNS: Temperature is 98.2, blood pressure  131/79, pulse 86, respiratory rate 18, oxygen saturation 99% on room  air.  GENERAL:  The patient is well-nourished, well-developed in no acute  distress.  HEENT: Normocephalic, atraumatic.  Pupils equal, round, reactive to  light.  Sclerae nonicteric.  Moist mucous  membranes.  She has no  ulcerations of her tongue or buccal mucosa.  NECK: Supple.  No carotid bruits.  LUNGS: Clear to auscultation bilaterally without wheezes, rhonchi or  rales.  CARDIOVASCULAR:  Regular rate and rhythm without murmurs, gallops or  rubs.  ABDOMEN:  Normal bowel sounds, soft, nontender, nondistended.  GU/RECTAL:  Deferred.  EXTREMITIES: No clubbing, cyanosis or edema.  SKIN:  No rash.  NEUROLOGIC: She is alert and oriented x3.  Cranial nerves are intact.  Initially when I entered the room she seemed to have some slurring of  her speech which improved over about a minute or two. Motor strength 5/5  throughout.  Deep tendon reflexes are symmetric.  Pinprick sensation is  normal. Gait normal.  PSYCHIATRIC:  Normal affect. Calm and cooperative.   LABS:  CBC unremarkable. Complete metabolic panel significant for  potassium of 3.1. Otherwise unremarkable.  Urine pregnancy negative.  Urinalysis shows small leukocyte esterase, 3-6 white cells, few  bacteria, negative nitrites,  few squamous epithelial cells.  CT of the  brain is normal.   ASSESSMENT/PLAN:  1. Transient episodes of dysarthria with mild right leg numbness which      has been constant since Thursday.  The patient will be placed on 23-      hour observation.  I will get any EKG.  She will be on telemetry.      Give aspirin. Check an MRI. Neuro checks and check fasting lipids      and homocystine. Further workup to follow based on MRI.  May need      neurology input.  Also check urine drug screen.  2. Hypertension, controlled.   1. Hypokalemia.  This will be repleted.      Corinna L. Lendell Caprice, MD  Electronically Signed     CLS/MEDQ  D:  11/18/2008  T:  11/18/2008  Job:  [id number]   cc:   Arta Silence, MD  Fax: [phone]

## 2011-05-01 NOTE — Op Note (Signed)
NAME:  Angel Phelps, Angel Phelps                      ACCOUNT NO.:  [id number]   MEDICAL RECORD NO.:  [id number]                   PATIENT TYPE:  INP   LOCATION:  5734                                 FACILITY:  [facility]   PHYSICIAN:  Petra Kuba, M.D.                 DATE OF BIRTH:  [birth year]   DATE OF PROCEDURE:  08/14/2003  DATE OF DISCHARGE:                                 OPERATIVE REPORT   PROCEDURES:  1. Endoscopic retrograde cholangiopancreatography.  2. Sphincterotomy.  3. Stone extraction.   ENDOSCOPIST:  Petra Kuba, M.D.   INDICATIONS:  Abnormal intraoperative cholangiogram.  CBD stones.  Consent  was signed after the risks, benefits, methods, options were thoroughly  discussed with both the patient and her husband on multiple occasions.   PREMEDICATION:  1. Demerol 125.  2. Versed 10.   DESCRIPTION OF PROCEDURE:  The therapeutic video side-viewing duodenoscope  was inserted by indirect vision into the stomach, advanced through a normal  antrum and pylorus, and a normal-appearing ampulla was brought into view.  Using a triple-lumen sphincterotome, initially a normal pancreatogram was  obtained.  Thus, the sphincterotome was repositioned and using the Jagwire,  was able to get deep selective cannulation.  On initial injections, possibly  some tiny filling defects were seen.   We then proceeded with a moderate size sphincterotomy in the customary  fashion until we had good biliary drainage and were able to get a full width  sphincterotome in and out of the duct.  We went ahead and exchanged the  sphincterotome for the adjustable balloon and using the 8.5 setting, went  ahead and proceeded with three balloon pull-throughs over the Jagwire in the  customary fashion.  Tiny debris was removed but no significant stone  fragments.   We went ahead and proceeded with two occlusion cholangiograms just to make  sure there were no missed lesions which were both done in the  customary  fashion without problems.  There was no obvious remaining filling defects.  She did seem to drain adequately.  We were able to pull the 8.5 mm  adjustable balloon through the ampulla with very minimal resistance.  The  scope was removed at this junction.   The patient tolerated the procedure well.  There was no obvious immediate  complication.   ENDOSCOPIC DIAGNOSES:  1. Normal ampulla.  2. Normal double occlusion pressure and few injections.  3. Tiny filling defect in the common bile duct on early injections.  4. Normal intrahepatic ducts.  5. Status post moderate size sphincterotomy and multiple 8.5 balloon pull-     throughs with minimal tiny stones and debris being removed.  6. Normal occlusion cholangiogram x2 at the end of the procedure.   PLAN:  Observe for delayed complications.  If note, probably home tomorrow.  Okay to breast-feed possibly in three days.  May need to consult the  breastfeeding specialist to  make sure that is okay; specifically, if she is  off pain medicines by then.  Otherwise, would follow liver tests until they  normalize which could be rechecked as an outpatient.                                               Petra Kuba, M.D.    MEM/MEDQ  D:  08/14/2003  T:  08/14/2003  Job:  [id number]   cc:   Velora Heckler, M.D.  [address] [place]  [place] [address]  Fax: [phone]

## 2011-05-01 NOTE — Consult Note (Signed)
   NAME:  Angel Phelps, Angel Phelps NO.:  [id number]   MEDICAL RECORD NO.:  [id number]                   PATIENT TYPE:  INP   LOCATION:  5734                                 FACILITY:  [facility]   PHYSICIAN:  Petra Kuba, M.D.                 DATE OF BIRTH:  1976/10/22   DATE OF CONSULTATION:  DATE OF DISCHARGE:                                   CONSULTATION   REFERRING PHYSICIAN:  Velora Heckler, M.D.   REASON FOR CONSULTATION:  The patient was seen at the request of Dr. Velora Heckler for an abnormal intraoperative cholangiogram.  Not only does she have  CBD stones but there is a question of atypical connection with CBD, PD, and  duodenum, although she is doing fine postoperatively.   PAST MEDICAL HISTORY:  Pertinent for cholecystectomy.  Two months ago had a  child and has high blood pressure.   SOCIAL HISTORY:  Does not smoke, drink or do drugs.   FAMILY HISTORY:  Negative.   CURRENT MEDICATIONS:  Prilosec, birth control pills, and Toprol.   REVIEW OF SYMPTOMS:  Negative except above.   PHYSICAL EXAMINATION:  GENERAL:  The patient looks well, still a little  sleepy from her surgery.  VITAL SIGNS:  Stable and afebrile.  Not examined today prior to the ERCP.   LABORATORY DATA:  Ultrasound pertinent for normal CBD, multiple small  gallstones.  Labs please see chart.  Very minimally elevated SGOT but  otherwise normal.  She was in the ER earlier in the month and her SGOT was  146, SGPT 72 with a normal CBC except for a white count of 13.  Lipase was  normal.  Other labs are normal.   ASSESSMENT:  1. Common bile duct stones.  2. 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    PLAN:  The risks, benefits, and methods of the ERCP were discussed with both  the patient and her husband.  We talked about that versus an open common  duct exploration.  We also talked about possibly the anatomy causing some  problems in our success rate.  We will proceed tomorrow at 2:30.  Thank you  very much for the consult.                                               Petra Kuba, M.D.    MEM/MEDQ  D:  08/13/2003  T:  08/13/2003  Job:  [id number]   cc:   Velora Heckler, M.D.  [address] [place]  [place] [address]  Fax: [phone]

## 2011-05-01 NOTE — H&P (Signed)
NAME:  Angel Phelps, Angel Phelps                      ACCOUNT NO.:  [id number]   MEDICAL RECORD NO.:  [id number]                   PATIENT TYPE:  INP   LOCATION:  5705                                 FACILITY:  [facility]   PHYSICIAN:  Velora Heckler, M.D.                DATE OF BIRTH:  1976-08-08   DATE OF ADMISSION:  08/13/2003  DATE OF DISCHARGE:                                HISTORY & PHYSICAL   REASON FOR ADMISSION:  Cholelithiasis, biliary colic.   PRIMARY CARE PHYSICIAN:  [facility].   GYNECOLOGIST:  Georgina Peer, M.D.   HISTORY OF PRESENT ILLNESS:  The patient is a 35 year old black female who  presented to the [facility] Emergency Department for the second time in 3  weeks, with episode of biliary colic.  The patient had eaten some sugar  cookies around midnight, she was awakened from sleep at 2:00 a.m. with right  upper quadrant epigastric abdominal pain with nausea. She experienced cold  chills. She came to the emergency department where she was seen and  evaluated.  Pain has now resolved.  The patient is interested in proceeding  with cholecystectomy during this admission.  The patient notes intermittent  attacks for the past 6 weeks.  A previous ultrasound on July 22, 2003 at  [facility] demonstrated multiple small gallstones without any signs  or symptoms of acute cholecystitis.  The patient denied jaundice, acholic  stools. There is no family history of biliary disease.   PAST MEDICAL HISTORY:  Hypertension, status post D&C, history of childbirth  2 months ago with healthy baby boy.   MEDICATIONS:  1. Toprol  XL 50 mg daily.  2. Birth control pills.  3. Prilosec 20 mg daily for 3 weeks.   ALLERGIES:  NO KNOWN DRUG ALLERGIES.   SOCIAL HISTORY:  Patient is married.  Her husband works for [employer]. She has 1 child, age 34 months.  She does not smoke. She does  not drink alcohol.   FAMILY HISTORY:  Noncontributory.   REVIEW OF  SYSTEMS:  A 15 systems review without significant other positives,  except as noted above.   PHYSICAL EXAMINATION:  VITAL SIGNS:  Temperature 98.0, blood pressure  158/98, pulse 64, respirations 16, O2 saturation 98%.  GENERAL:  A 35-year-  old, bright, alert black female in no acute distress, in room 7 in the  emergency department.  HEENT:  Normocephalic, atraumatic.  Sclerae clear,  conjunctivae clear.  Pupils are equal and reactive. Dentition is good.  Voice is normal.  NECK: Supple without mass. Thyroid is normal without  nodularity. There is no anterior or posterior cervical adenopathy.  There  are no supraclavicular masses.  LUNGS:  Clear to auscultation without rales or rhonchi.  There is no costal  vertebral angle tenderness.  HEART:  Regular rate and rhythm without murmur.  Peripheral pulses are full.  ABDOMEN:  Soft  without distention. There are  multiple striae on the abdominal wall. There are bowel sounds present. There  is no palpable mass, there is no significant tenderness.  EXTREMITIES:  Nontender without edema.  NEUROLOGIC:  Patient is alert and oriented without  focal deficit.   LABORATORY DATA:  Complete blood count shows white count 6.4, hemoglobin  13.7, hematocrit 40.4%, platelet count 313,000. Differential normal.  Chemistry profile notable for potassium of 2.9 and SGOT of 43, total  bilirubin normal at 0.7, amylase normal at 104.  Pregnancy test is negative.  Urinalysis shows small hemoglobin and trace leukocyte esterase, although  patient is on her menstrual cycle.   IMPRESSION:  1. Biliary colic, recurrent.  2. Cholelithiasis.  3. Hypertension.  4. Hypokalemia.   PLAN:  1. Admission to [facility].  2. Preoperative preparation for cholecystectomy.  3. To operating room for laparoscopic cholecystectomy with intraoperative     cholangiogram.  4. Routine postoperative care.   I discussed at length with the patient the indications to proceed with   surgery. She has had at least 3 episodes of biliary colic within the last 6  weeks. She has had 2 episodes severe enough to bring her to the emergency  department.  She has seen Dr. Ovidio Kin in our office at [facility].  He is involved with complex cases at [facility] today  and is unable to see the patient. I have offered to admit her and proceed  with cholecystectomy.  Patient is in agreement.                                                Velora Heckler, M.D.    TMG/MEDQ  D:  08/13/2003  T:  08/13/2003  Job:  [id number]   cc:   [facility]   Georgina Peer, M.D.  [phone]  [place]  [place] [address]  Fax: [phone]

## 2011-05-01 NOTE — Op Note (Signed)
NAME:  Angel Phelps, YALE                      ACCOUNT NO.:  [id number]   MEDICAL RECORD NO.:  [id number]                   PATIENT TYPE:  INP   LOCATION:  5705                                 FACILITY:  [facility]   PHYSICIAN:  Velora Heckler, M.D.                DATE OF BIRTH:  15-Jan-1976   DATE OF PROCEDURE:  08/13/2003  DATE OF DISCHARGE:                                 OPERATIVE REPORT   PREOPERATIVE DIAGNOSIS:  Symptomatic cholelithiasis.   POSTOPERATIVE DIAGNOSIS:  Symptomatic cholelithiasis, choledocholithiasis.   PROCEDURES:  Laparoscopic cholecystectomy with interoperative  cholangiography.   SURGEON:  Velora Heckler, M.D.   ASSISTANT:  Jimmye Norman, M.D.   ANESTHESIA:  General.   ESTIMATED BLOOD LOSS:  Minimal.   PREPARATION:  Betadine.   COMPLICATIONS:  None.   INDICATIONS FOR PROCEDURE:  The patient is a 35 year old black female who  presents to the emergency department with her 3rd episode of biliary colic  in the past 6 weeks. The patient had been seen 3 weeks ago in the emergency  department. An ultrasound demonstrated multiple gallstones. She was seen in  the office by Dr. Ovidio Kin. At that time the patient deferred having  surgery. The patient had onset of epigastric  and right upper quadrant  abdominal pain approximately 2 a.m. this morning. She presented to the  emergency department for assessment. She agreed with proceeding with  cholecystectomy today.   FINDINGS:  Common bile duct stones noted on cholangiography. There is distal  obstruction. There is filling of the pancreatic ductal system with  retrograde flow of contrast through what appears to be a small  accessory  duct into the duodenum. The major ampulla is never identified.   DESCRIPTION OF PROCEDURE:  The procedure is done in operating room #15 at  the [facility]. The patient was brought to the  operating room and placed in the supine position on the operating table.  Following  the administration of general anesthesia the patient was prepped  and draped in the usual strict aseptic fashion.   After ascertaining that an adequate level of anesthesia had been obtained,  an infraumbilical incision was made with a #15 blade. Dissection was carried  down through the subcutaneous tissues to the fascia. The fascia is incised  in the midline  and the peritoneal cavity is entered cautiously.   A 0 Vicryl pursestring suture is placed in the fascia. An Hasson cannula is  introduced under direct vision and secured with a pursestring suture. The  abdomen is insufflated with CO2. The laparoscope was introduced and the  abdomen is explored.   Operative ports were placed along the right costal margin in the midline,  midclavicular line and the anterior axillary line. The fundus of the  gallbladder  is grasped and retracted  cephalad. Dissection is begun at the  neck  of the gallbladder. Small stones are visible within  the neck  of the  gallbladder. The cystic duct is dissected out along its length and a clip is  placed at the neck  of the gallbladder  to prevent stones from entering the  cystic duct.   The cystic duct is incised with the endo shears. Immediately removed are 2  small, yellow cholesterol stones which are quite soft. These measure a few  millimeters in diameter. Upon massaging the cystic duct further, soft,  yellow stonelike objects are extracted.   Next a Cook cholangiography catheter  is introduced through a stab wound in  the right upper quadrant. It is inserted into the cystic duct and secured  with a Ligaclip. Using C-arm fluoroscopy, real time cholangiography is  performed. There is flow through a small cystic duct into a mildly dilated  common bile duct. There is reflux of contrast into the right and left ductal  systems. There appears to be filling defects in the main common bile duct.  As contrast reaches the distal extent of the duct, the  duct appears to be  obstructed by multiple small filling defects. There is retrograde filling of  the pancreatic duct. There appears to be a small  accessory pancreatic duct  which fills and drains into the duodenum. No contrast goes through the major  ampulla into the duodenum. Representative  photographs of this study are  incorporated into the medical record.   The clip is withdrawn and the [facility] catheter is removed from the right upper  quadrant. The cystic duct is triply clipped and divided. The cystic artery  is dissected out, doubly clipped and divided. The posterior branch of the  cystic artery is doubly clipped and divided. The gallbladder  is excised  from the gallbladder bed using the hook electrocautery for hemostasis.   The gallbladder  is completely excised and placed into an EndoCatch bag  which is then removed through the umbilical port without difficulty. The  right upper quadrant was copious irrigated with warm saline and good  hemostasis is obtained in the gallbladder  bed with the hook electrocautery.  Saline is evacuated.   The ports were removed under direct vision.  Pneumoperitoneum is released.  Then 0 Vicryl pursestring sutures are tied securely. All port  sites were  anesthetized with local anesthetic. All sites were closed with interrupted 4-  0 Vicryl subcuticular sutures. The wounds are washed and dried and Benzoin  and Steri-Strips are applied. Sterile gauze dressings are applied.   The patient was awakened from anesthesia and transported to the recovery  room in stable condition. The patient tolerated the procedure well.                                               Velora Heckler, M.D.    TMG/MEDQ  D:  08/13/2003  T:  08/13/2003  Job:  [id number]   cc:   Georgina Peer, M.D.  [address]  [place]  [place] [address]  Fax: [phone]   [facility]

## 2011-05-01 NOTE — Discharge Summary (Signed)
NAMEMarland Phelps  Angel, Phelps            ACCOUNT NO.:  [id number]   MEDICAL RECORD NO.:  [id number]          PATIENT TYPE:  INP   LOCATION:  9306                          FACILITY:  [facility]   PHYSICIAN:  Roseanna Rainbow, M.D.DATE OF BIRTH:  18-Jul-1976   DATE OF ADMISSION:  10/02/2004  DATE OF DISCHARGE:  10/04/2004                                 DISCHARGE SUMMARY   CHIEF COMPLAINT:  The patient is a 35 year old with a history of chronic  hypertension, first trimester intrauterine pregnancy with severe elevations  of blood pressure.   HISTORY OF PRESENT ILLNESS:  The patient had presented to the office for  routine prenatal visit and was found to have elevated blood pressures in the  severe range.  She denied any concomitant complaints.   ALLERGIES:  No known drug allergies.   SOCIAL HISTORY:  She denies any tobacco, ethanol, or substance abuse.   PAST MEDICAL HISTORY:  Remarkable for chronic hypertension.   PAST OBSTETRICAL HISTORY:  She has a history of D&C.   PAST SURGICAL HISTORY:  She has a history of cholecystectomy.   PHYSICAL EXAMINATION:  VITAL SIGNS:  Blood pressure 140/80-100.  Temperature  98.8, pulse 98, respiratory rate 20.  GENERAL:  Well-developed, well-nourished, in no acute distress.  ABDOMEN:  Nontender.  PELVIC:  Deferred.  EXTREMITIES:  No cyanosis, clubbing, or edema.   ASSESSMENT:  First trimester pregnancy with severe elevated blood pressures.   PLAN:  Admission, blood pressure control.   HOSPITAL COURSE:  The patient was admitted and subsequently started on p.o.  labetalol.  Her blood pressure stabilized in the 140's over 80 range and she  was discharged to home on October 04, 2004.   DISCHARGE DIAGNOSES:  1.  Chronic hypertension.  2.  Early pregnancy, first trimester.   CONDITION ON DISCHARGE:  Stable.   DIET:  Low sodium.   MEDICATIONS:  Labetalol and Phenergan.   DISPOSITION:  The patient is to keep her previous appointment in the  office.  She was also counseled to follow up with an ophthalmologist for a  funduscopic examination.     Collier Flowers  D:  10/20/2004  T:  10/20/2004  Job:  [id number]

## 2011-05-01 NOTE — H&P (Signed)
NAMEMarland Kitchen  WINNI, EHRHARD NO.:  [id number]   MEDICAL RECORD NO.:  [id number]          PATIENT TYPE:  INP   LOCATION:  9172                          [facility]ACILITY:  [facility]   PHYSICIAN:  Roseanna Rainbow, M.D.DATE OF BIRTH:  May 21, 1976   DATE OF ADMISSION:  03/05/2005  DATE OF DISCHARGE:                                HISTORY & PHYSICAL   CHIEF COMPLAINT:  The patient is a 35 year old with an intrauterine  pregnancy at 44 plus weeks with an estimated date of confinement of May 30, 2005 with chronic hypertension, rule out superimposed preeclampsia.   HISTORY OF PRESENT ILLNESS:  The patient was known to have preexisting  hypertension prior to pregnancy. She has been on Norvasc and labetalol and  has been seen in consultation with the [facility] office. Her blood  pressures have become increasingly labile. At the office visit today  urinalysis was remarkable for proteinuria measuring 3 plus.  She denied any  concomitant symptoms consistent with severe preeclampsia.   ALLERGIES:  No known drug allergies.   SOCIAL HISTORY:  She denies any tobacco, ethanol or substance abuse.   PAST MEDICAL HISTORY:  Please see the above.   PAST OB/GYN HISTORY:  She has a history of a D&C.   PAST SURGICAL HISTORY:  She has a history of a cholecystectomy.   MEDICATIONS:  Labetalol and Norvasc prenatal vitamins.   PHYSICAL EXAMINATION:  VITAL SIGNS:  Blood pressure 159/88, temperature  97.4, pulse 83, weight 209 pounds.  GENERAL:  A well-developed, well-nourished, African-American female in no  apparent distress.  ABDOMEN:  Nontender.  PELVIC:  Deferred.  EXTREMITIES:  Minimal edema.   Fetal heart rate with the Doppler 140's.   ASSESSMENT:  Intrauterine pregnancy at 27 plus weeks with history of chronic  hypertension, rule out superimposed severe preeclampsia.   PLAN:  Admission serial labs.  Followup with the results from the 24 hour  urine and urine culture and sensitivity  performed in the office today.  Complete obstetrical ultrasound for growth, bed rest, serial weights,  possible inpatient consultation with maternal fetal medicine, possible  steroids to stimulate fetal lung maturity.      LAJ/MEDQ  D:  03/05/2005  T:  03/05/2005  Job:  [id number]

## 2011-05-01 NOTE — Op Note (Signed)
Angel Phelps, Angel Phelps            ACCOUNT NO.:  [id number]   MEDICAL RECORD NO.:  [id number]          PATIENT TYPE:  INP   LOCATION:  9371                          [facility]  [facility]   PHYSICIAN:  Charles A. Clearance Coots, M.D.DATE OF BIRTH:  11-14-1976   DATE OF PROCEDURE:  04/02/2005  DATE OF DISCHARGE:                                 OPERATIVE REPORT   PREOPERATIVE DIAGNOSES:  1.  [redacted] weeks gestation.  2.  Severe preeclampsia.  3.  Induction of labor.  4.  Late fetal heart rate decelerations.   POSTOPERATIVE DIAGNOSES:  1.  [redacted] weeks gestation.  2.  Severe preeclampsia.  3.  Induction of labor.  4.  Late fetal heart rate decelerations.   PROCEDURES:  Primary low transverse cesarean section.   SURGEON:  Charles A. Clearance Coots, M.D.   ANESTHESIA:  Epidural.   ESTIMATED BLOOD LOSS:  600 mL.   INTRAVENOUS FLUIDS:  1350 mL.   URINE OUT:  200 mL.   COMPLICATIONS:  None.   FINDINGS:  Viable female at 0925 hours.  Apgars of 4 at one minute and 7 at  five minutes.  Weight of 1463 g.  Cord pH of 7.24.  Normal uterus, ovaries  and fallopian tubes.   DESCRIPTION OF PROCEDURE:  The patient was brought to the operating room and  after satisfactory redosing of the epidural, the abdomen was prepped and  draped in the usual sterile fashion.  A Pfannenstiel skin incision was made  with the scalpel that was deepened down to the fascia with the scalpel.  The  fascia was nicked in the midline and the fascia incision was extended to the  left and to the right with curved Mayo scissors.  The superior and inferior  fascial edges were taken off of the rectus muscles with blunt and sharp  dissection.  The rectus muscle was bluntly and sharply divided in the  midline.  The peritoneum was entered digitally and was digitally extended to  the left and to the right.  The bladder blade was positioned.  The  vesicouterine fold of peritoneum above the reflection of the urinary bladder  was grasped with forceps and  was incised and undermined with Metzenbaum  scissors.  The incision was extended to the left and to the right with  Metzenbaum scissors.  The bladder flap was developed and the bladder blade  was repositioned in front of the urinary bladder, placing it well out of the  operative field.  The uterus was then entered in the lower uterine segment  transversely with the scalpel.  Clear amniotic fluid was expelled.  The  uterine incision was extended to the left and to the right distally.  The  occiput was then brought up into the incision and the vertex was delivered  with the aid of fundal pressure from the assistant.  The infant's mouth and  nose were suctioned with a suction bulb and delivery was completed with aid  of fundal  pressure from the assistant.  The umbilical cord was doubly  clamped and cut and the infant was handed off to the nursery staff.  Cord  blood and a cord pH were obtained.  The placenta was spontaneously expelled  from the uterine cavity intact.  The endometrial surface was thoroughly  debrided with a dry lap sponge.  The edges of the uterine incision were  grasped with ring forceps and the uterus was closed with a continuous  interlocking layer of 0 Monocryl.  Hemostasis was obtained.  The pelvic  cavity was then irrigated with warm saline solution and all clots were  removed.  The abdomen was then closed as follows:  The peritoneum was closed  with a continuous suture of 2-0 Monocryl.  The fascia was closed with a  continuous suture of 0 Vicryl.  The subcutaneous tissue was thoroughly  irrigated with warm saline solution.  All areas of subcutaneous bleeding  were coagulated with the Bovie.  The skin was then closed with a continuous  subcuticular suture of 3-0 Monocryl.  A sterile bandage was applied to the  incision closure.  The surgical technician indicated that all needle, sponge  and instrument counts were correct.  The patient tolerated the procedure  well and  was transported to the recovery room in satisfactory condition.      CAH/MEDQ  D:  04/02/2005  T:  04/02/2005  Job:  [id number]

## 2011-05-01 NOTE — Discharge Summary (Signed)
   NAME:  Angel Phelps, Angel Phelps                      ACCOUNT NO.:  [id number]   MEDICAL RECORD NO.:  [id number]                   PATIENT TYPE:  INP   LOCATION:  5734                                 FACILITY:  [facility]   PHYSICIAN:  Velora Heckler, M.D.                DATE OF BIRTH:  [birth year]   DATE OF ADMISSION:  08/13/2003  DATE OF DISCHARGE:  08/15/2003                                 DISCHARGE SUMMARY   REASON FOR ADMISSION:  Abdominal pain, cholelithiasis.   BRIEF HISTORY:  The patient is a 35 year old black female who presents again  to the emergency department with a six-week history of intermittent  abdominal pain and nausea.  She has had three discrete episodes of biliary  colic.  This was her second trip to the emergency department at [facility].  Ultrasound on August 8 demonstrated small gallstones.  There was  a slight increase in her SGOT level on today's laboratories.  The patient is  seen by general surgery and admitted onto the general surgical service.   HOSPITAL COURSE:  The patient was admitted on August 30 from the emergency  department.  After preparation, she was brought to the operating room where  she underwent laparoscopic cholecystectomy with intraoperative  cholangiography.  The patient was found to have common bile duct stones.  She was seen postoperatively by Dr. Vida Rigger.  On August 31, she underwent  ERCP.  There were numerous filling defects in the common bile duct.  The  patient had sphincterotomy and balloon pull-through with clearing of the  common bile duct.  She was prepared for discharge home on the second  postoperative day.   DISCHARGE PLANNING:  The patient is discharged home on August 15, 2003, in  good condition, tolerating a regular diet, and ambulating independently.  She will be seen back in my office at [facility] in two to  three weeks with followup laboratory studies including an hepatic profile  and an amylase  level.  Discharge medications include Vicodin as needed for  pain.    FINAL DIAGNOSES:  1. Cholelithiasis.  2. Choledocholithiasis.   CONDITION ON DISCHARGE:  Improved.                                                Velora Heckler, M.D.    TMG/MEDQ  D:  08/15/2003  T:  08/15/2003  Job:  [id number]   cc:   Petra Kuba, M.D.  [address]  [place]  [place] [address]  Fax: [phone]   [facility]

## 2011-05-01 NOTE — Op Note (Signed)
[facility]  Patient:    Angel Phelps, Angel Phelps Visit Number: [id number] MRN: [id number]          Service Type: DSU Location: [facility] Attending Physician:  Pleas Koch Dictated by:   Georgina Peer, M.D. Proc. Date: 06/02/02 Admit Date:  06/02/2002 Discharge Date: 06/02/2002   CC:         Dyanne Carrel, M.D., [facility]   Operative Report  PREOPERATIVE DIAGNOSIS:       Incomplete abortion.  POSTOPERATIVE DIAGNOSIS:      Incomplete abortion.  OPERATION:                    Suction dilatation and evacuation.  SURGEON:                      Georgina Peer, M.D.  ANESTHESIA:                   Monitored anesthesia care using paracervical                               block and 1% Xylocaine.  ESTIMATED BLOOD LOSS:         Less than 50 cc.  COMPLICATIONS:                None.  FINDINGS:                     A six weeks uterus, products of conception retained in endometrium.  INDICATIONS:                  This is a 35 year old gravida 1, para 0, with confirmed pregnancy and vaginal bleeding.  Following quantitative hCGs five days ago, an empty sac was seen.  The patient had heavy vaginal bleeding for two to three days, and elected D&E for management.  An ultrasound prior to surgery confirmed retained products of conception, but no intrauterine gestational sac.  The cervix was not open.  The patient is Rh positive.  Her preoperative hemoglobin was 12.9, platelet count 348,000.  Blood type B-positive.  DESCRIPTION OF PROCEDURE:     The patient was taken to the operating room and given IV sedation and placed in the dorsolithotomy position.  Perineum and vagina were prepped and draped sterilely.  Catheter to empty the bladder.  The uterus was six weeks size, anterior, soft, and with a closed cervix, and red vaginal bleeding noted.  The tenaculum was placed on the cervix and 20 cc of 1% Xylocaine was injected into the  paracervical tissues.  After an adequate waiting period, a progressive dilatation to 27-French with Shawnie Pons dilators was accomplished easily and an 8 curved curet was used to evacuate the uterine contents.  Gross products of conception and old blood were both recovered.  After several passes with the curet, a gentle sharp curettage was performed, and no residual tissue was noted.  The tenaculum and other instruments were removed.  The sponge, needle, and instrument counts were correct.  The patient was returned to the recovery area in good condition.  She received IV Toradol before going to the recovery room. Dictated by:   Georgina Peer, M.D. Attending Physician:  Pleas Koch DD:  06/02/02 TD:  06/05/02 Job: [id number]

## 2011-05-01 NOTE — Discharge Summary (Signed)
NAMEMarland Kitchen  Angel Phelps, Angel Phelps NO.:  [id number]   MEDICAL RECORD NO.:  [id number]          PATIENT TYPE:  INP   LOCATION:  9137                          [facility]   PHYSICIAN:  Roseanna Rainbow, M.D.DATE OF BIRTH:  November 10, 1976   DATE OF ADMISSION:  03/05/2005  DATE OF DISCHARGE:  03/20/2005                                 DISCHARGE SUMMARY   CHIEF COMPLAINT:  The patient is a 35 year old with an intrauterine  pregnancy at 16 plus weeks with an estimated date of confinement of May 30, 2005 with chronic hypertension, rule out superimposed pre-eclampsia. Please  see the dictated history and physical for further details.   HOSPITAL COURSE:  The patient was admitted. Non-stress tests were performed  twice a day. Initial pregnancy induced hypertension labs were normal. A 24  hour urine for protein and creatinine on March 05, 2005 demonstrated 1.5  grams of protein. The findings were reviewed with Dr. Gavin Potters, who  recommended betamethasone, continued close surveillance in the hospital for  now with possible outpatient management if she remained stable. He also  recommended to continue her anti-hypertensive regimen and to keep her  diastolic blood pressures greater or equal to 90.  He suggeseted that we  check a fasting blood glucose. Her blood pressure stabilized on bedrest. She  had initially been on Labetalol and Norvasc for blood pressure control and  the Norvasc was held. A repeat 24 hour urine for protein and creatinine on  March 13, 2005 was remarkable for 2633 mg of protein. Fetal monitor stress  test remained reassuring. Her blood pressures became somewhat labile and her  Labetalol dosage was titrated to 300 mg three times a day. A repeat 24 hour  urine on March 20, 2005 demonstrated 2320 mg of protein. As this was stable,  fetal testing remained reassuring, she was discharged to home.   DISCHARGE DIAGNOSES:  1.  Intrauterine pregnancy at 30 plus weeks.  2.   Pregnancy induced hypertension.   CONDITION ON DISCHARGE:  Stable.   DIET:  Regular.   ACTIVITY:  Modified bedrest.   DISCHARGE MEDICATIONS:  1.  Labetalol.  2.  Prenatal vitamins.   DISPOSITION:  An [facility] referral was made for daily  weights and blood pressure check and evaluation for pregnancy induced  hypertension symptoms.   FOLLOW UP:  She was to be followed in the office for twice weekly fetal  testing and blood pressure checks, weekly labs and 24 hour urine testing.      LAJ/MEDQ  D:  04/12/2005  T:  04/13/2005  Job:  [id number]

## 2011-05-26 ENCOUNTER — Encounter: Payer: Self-pay | Admitting: Cardiovascular Disease

## 2011-09-17 LAB — CSF CELL COUNT WITH DIFFERENTIAL
Tube #: 3
WBC, CSF: 1 /mm3 (ref 0–5)

## 2011-09-17 LAB — PROTEIN AND GLUCOSE, CSF
Glucose, CSF: 59 mg/dL (ref 43–76)
Total  Protein, CSF: 30 mg/dL (ref 15–45)

## 2011-09-17 LAB — OLIGOCLONAL BANDS, CSF + SERM
Albumin, Serum(Neph): 4350 mg/dL (ref 3500–5200)
IgG, CSF: 4.3 mg/dL (ref 0.0–6.0)
IgG, Serum: 1030 mg/dL (ref 768–1632)
MS CNS IgG Synthesis Rate: 8.5 mg/d — ABNORMAL HIGH (ref <=8.0)

## 2011-09-18 LAB — COMPREHENSIVE METABOLIC PANEL
AST: 15 U/L (ref 0–37)
Albumin: 4 g/dL (ref 3.5–5.2)
Calcium: 9.5 mg/dL (ref 8.4–10.5)
Chloride: 100 mEq/L (ref 96–112)
Creatinine, Ser: 0.74 mg/dL (ref 0.4–1.2)
GFR calc Af Amer: >60 mL/min (ref >60)

## 2011-09-18 LAB — URINALYSIS, ROUTINE W REFLEX MICROSCOPIC
Bilirubin Urine: NEGATIVE
Nitrite: NEGATIVE
Specific Gravity, Urine: 1.027 (ref 1.005–1.030)
Urobilinogen, UA: 1 mg/dL (ref 0.0–1.0)
pH: 6 (ref 5.0–8.0)

## 2011-09-18 LAB — POCT PREGNANCY, URINE: Preg Test, Ur: NEGATIVE

## 2011-09-18 LAB — RAPID URINE DRUG SCREEN, HOSP PERFORMED
Amphetamines: NOT DETECTED
Benzodiazepines: NOT DETECTED
Cocaine: NOT DETECTED

## 2011-09-18 LAB — URINE MICROSCOPIC-ADD ON

## 2011-09-18 LAB — DIFFERENTIAL
Eosinophils Relative: 1 % (ref 0–5)
Lymphocytes Relative: 27 % (ref 12–46)
Lymphs Abs: 2.6 10*3/uL (ref 0.7–4.0)
Monocytes Absolute: 0.6 10*3/uL (ref 0.1–1.0)

## 2011-09-18 LAB — CBC
MCV: 96.8 fL (ref 78.0–100.0)
Platelets: 367 10*3/uL (ref 150–400)
WBC: 9.5 10*3/uL (ref 4.0–10.5)

## 2011-09-18 LAB — HOMOCYSTEINE: Homocysteine: 10.7 umol/L (ref 4.0–15.4)

## 2011-09-18 LAB — LIPID PANEL
HDL: 28 mg/dL — ABNORMAL LOW (ref >39)
Total CHOL/HDL Ratio: 5.7 RATIO

## 2011-12-15 NOTE — L&D Delivery Note (Signed)
Delivery Note At 4:20 PM a viable female was delivered via VBAC, Spontaneous (Compound Presentation: Left Occiput Anterior, posterior arm).    A tight nuchal cord x 1 was clamped and cut prior to delivery of the anterior shoulder.   Placenta status: Intact, Manual removal.  The placenta had not completely separated.  A cleavage plane was bluntly developed to facilitate delivery. Expressed.  Cord: 3 vessels.   Anesthesia: Epidural  Episiotomy: None Lacerations: 1st degree;Periurethral Suture Repair: 3.0 vicryl rapide Est. Blood Loss (mL): 300  Mom to postpartum.  Baby to nursery-stable.  Angel Phelps,Angel Phelps A 10/13/2012, 4:42 PM

## 2012-02-09 LAB — OB RESULTS CONSOLE ANTIBODY SCREEN: Antibody Screen: NEGATIVE

## 2012-02-09 LAB — OB RESULTS CONSOLE RPR: RPR: NONREACTIVE

## 2012-02-16 ENCOUNTER — Other Ambulatory Visit: Payer: Self-pay | Admitting: Obstetrics

## 2012-02-16 ENCOUNTER — Ambulatory Visit (HOSPITAL_COMMUNITY)
Admission: RE | Admit: 2012-02-16 | Discharge: 2012-02-16 | Disposition: A | Discharge status: Home / Self Care | Payer: BC Managed Care – PPO | Source: Ambulatory Visit | Attending: Obstetrics | Admitting: Obstetrics

## 2012-02-16 DIAGNOSIS — O34219 Maternal care for unspecified type scar from previous cesarean delivery: Secondary | ICD-10-CM | POA: Insufficient documentation

## 2012-02-16 DIAGNOSIS — Z3689 Encounter for other specified antenatal screening: Secondary | ICD-10-CM | POA: Insufficient documentation

## 2012-02-16 DIAGNOSIS — O3680X0 Pregnancy with inconclusive fetal viability, not applicable or unspecified: Secondary | ICD-10-CM

## 2012-04-08 ENCOUNTER — Inpatient Hospital Stay (HOSPITAL_COMMUNITY)
Admission: AD | Admit: 2012-04-08 | Discharge: 2012-04-08 | Disposition: A | Discharge status: Home / Self Care | Payer: BC Managed Care – PPO | Source: Ambulatory Visit | Attending: Obstetrics | Admitting: Obstetrics

## 2012-04-08 ENCOUNTER — Encounter (HOSPITAL_COMMUNITY): Payer: Self-pay

## 2012-04-08 DIAGNOSIS — N390 Urinary tract infection, site not specified: Secondary | ICD-10-CM | POA: Insufficient documentation

## 2012-04-08 DIAGNOSIS — O2390 Unspecified genitourinary tract infection in pregnancy, unspecified trimester: Secondary | ICD-10-CM | POA: Insufficient documentation

## 2012-04-08 DIAGNOSIS — R109 Unspecified abdominal pain: Secondary | ICD-10-CM | POA: Insufficient documentation

## 2012-04-08 LAB — URINALYSIS, ROUTINE W REFLEX MICROSCOPIC
Ketones, ur: NEGATIVE mg/dL
Nitrite: NEGATIVE
Protein, ur: NEGATIVE mg/dL

## 2012-04-08 MED ORDER — CEPHALEXIN 500 MG PO CAPS: 500.0000 mg | ORAL_CAPSULE | Freq: Four times a day (QID) | ORAL | Status: AC

## 2012-04-08 NOTE — MAU Note (Signed)
Patient states she started having lower abdominal cramping this am while walking. Denies any bleeding or vaginal discharge. No vomiting or diarrhea.

## 2012-04-08 NOTE — Discharge Instructions (Signed)
Urinary Tract Infection in Pregnancy A urinary tract infection (UTI) is a bacterial infection of the urinary tract. Infection of the urinary tract can include the ureters, kidneys (pyelonephritis), bladder (cystitis), and urethra (urethritis). All pregnant women should be screened for bacteria in the urinary tract. Identifying and treating a UTI will decrease the risk of preterm labor and developing more serious infections in both the mother and baby. CAUSES Bacteria germs cause almost all UTIs. There are many factors that can increase your chances of getting a UTI during pregnancy. These include:  Having a short urethra.   Poor toilet and hygiene habits.   Sexual intercourse.   Blockage of urine along the urinary tract.   Problems with the pelvic muscles or nerves.   Diabetes.   Obesity.   Bladder problems after having several children.   Previous history of UTI.  SYMPTOMS   Pain, burning, or a stinging feeling when urinating.   Suddenly feeling the need to urinate right away (urgency).   Loss of bladder control (urinary incontinence).   Frequent urination, more than is common with pregnancy.   Lower abdominal or back discomfort.   Bad smelling urine.   Cloudy urine.   Blood in the urine (hematuria).   Fever.  When the kidneys are infected, the symptoms may be:  Back pain.   Flank pain on the right side more so than the left.   Fever.   Chills.   Nausea.   Vomiting.  DIAGNOSIS   Urine tests.   Additional tests and procedures may include:   Ultrasound of the kidneys, ureters, bladder, and urethra.   Looking in the bladder with a lighted tube (cystoscopy).   Certain X-ray studies only when absolutely necessary.  Finding out the results of your test Ask when your test results will be ready. Make sure you get your test results. TREATMENT  Antibiotic medicine by mouth.   Antibiotics given through the vein (intravenously), if needed.  HOME CARE  INSTRUCTIONS   Take your antibiotics as directed. Finish them even if you start to feel better. Only take medicine as directed by your caregiver.   Drink enough fluids to keep your urine clear or pale yellow.   Do not have sexual intercourse until the infection is gone and your caregiver says it is okay.   Make sure you are tested for UTIs throughout your pregnancy if you get one. These infections often come back.  Preventing a UTI in the future:  Practice good toilet habits. Always wipe from front to back. Use the tissue only once.   Do not hold your urine. Empty your bladder as soon as possible when the urge comes.   Do not douche or use deodorant sprays.   Wash with soap and warm water around the genital area and the anus.   Empty your bladder before and after sexual intercourse.   Wear underwear with a cotton crotch.   Avoid caffeine and carbonated drinks. They can irritate the bladder.   Drink cranberry juice or take cranberry pills. This may decrease the risk of getting a UTI.   Do not drink alcohol.   Keep all your appointments and tests as scheduled.  SEEK MEDICAL CARE IF:   Your symptoms get worse.   You are still having fevers 2 or more days after treatment begins.   You develop a rash.   You feel that you are having problems with medicines prescribed.   You develop abnormal vaginal discharge.  SEEK IMMEDIATE MEDICAL   CARE IF:   You develop back or flank pain.   You develop chills.   You have blood in your urine.   You develop nausea and vomiting.   You develop contractions of your uterus.   You have a gush of fluid from the vagina.  MAKE SURE YOU:   Understand these instructions.   Will watch your condition.   Will get help right away if you are not doing well or get worse.  Document Released: 03/27/2011 Document Revised: 11/19/2011 Document Reviewed: 03/27/2011 Exit[employer] Patient Information 2012 [employer]. 

## 2012-04-08 NOTE — MAU Note (Signed)
Pt states lower abdominal cramping that began this am at 0630. Denies bleeding or vaginal d/c changes. No medications for pain taken.

## 2012-04-08 NOTE — MAU Provider Note (Signed)
History     CSN: [id number]  Arrival date and time: 04/08/12 4098   First Provider Initiated Contact with Patient 04/08/12 1059      Chief Complaint  Patient presents with  . Abdominal Pain   HPI This is a 36 y.o. female at [redacted]w[redacted]d who presents with pelvic cramping, only when she walks around. When she lies down, it stops. Urinates frequently , had UTI before pregnant. Denies bleeding. Has had multiple visits at [facility] due to HR status (Htn).  OB History    Grav Para Term Preterm Abortions TAB SAB Ect Mult Living   6 2 1 1 3  2 1  2       Past Medical History  Diagnosis Date  . HTN in pregnancy, chronic     Took Labetol, G3P1001 term female, no comps, miscarried, 1st trimester, failed IUD [facility]) 11/04  . Hypertension   . Obesity   . Multiple sclerosis   . Preterm labor     Past Surgical History  Procedure Date  . Gravida     4/para2/miscarriged2; preeclamptic #2 pregnancy  . Spinal tap 11/27/2008  . Mri 11/18/2008    Head, abn  . Cesarean section   . Cholecystectomy   . Salpingectomy     R tube    Family History  Problem Relation Age of Onset  . Hypertension Mother   . Heart attack Mother 37    Massive MI  . Hypertension Father   . Hyperlipidemia Father   . Diabetes Father   . Seizures Brother   . Seizures Maternal Aunt   . Heart attack Maternal Grandmother     MI  . Hypertension Maternal Grandmother   . Anesthesia problems Neg Hx     History  Substance Use Topics  . Smoking status: Never Smoker   . Smokeless tobacco: Never Used  . Alcohol Use: No    Allergies: No Known Allergies  Prescriptions prior to admission  Medication Sig Dispense Refill  . amLODipine (NORVASC) 5 MG tablet Take 5 mg by mouth daily.        Marland Kitchen aspirin EC 81 MG tablet Take 81 mg by mouth daily.      . methyldopa (ALDOMET) 250 MG tablet Take 250 mg by mouth 3 (three) times daily.      . Prenatal Vit-Fe Fumarate-FA (PRENATAL MULTIVITAMIN) TABS Take 1 tablet by mouth at bedtime.         Review of Systems  Constitutional: Negative for fever and chills.  Gastrointestinal: Positive for abdominal pain (cramping). Negative for nausea, vomiting and diarrhea.  Genitourinary: Positive for frequency. Negative for dysuria, urgency, hematuria and flank pain.    Physical Exam   Blood pressure 134/80, pulse 76, temperature 98.9 F (37.2 C), temperature source Oral, resp. rate 16, height 5\' 5"  (1.651 m), weight 212 lb (96.163 kg), last menstrual period 01/16/2012, SpO2 99.00%.  Physical Exam  Constitutional: She is oriented to person, place, and time. She appears well-developed and well-nourished.  HENT:  Head: Normocephalic.  Cardiovascular: Normal rate.   Respiratory: Effort normal.  GI: Soft. Bowel sounds are normal. She exhibits no distension. There is no tenderness. There is no rebound and no guarding.  Genitourinary: Vagina normal and uterus normal. No vaginal discharge found.       Cervix long and  Closed   Musculoskeletal: Normal range of motion.  Neurological: She is alert and oriented to person, place, and time.  Skin: Skin is warm and dry.  Psychiatric: She has a  normal mood and affect.    MAU Course  Procedures  Assessment and Plan  A:  SIUP at [redacted]w[redacted]d       Cramping, possibly due to UTI      Leukocytes in urine, poss. UTI P:  Urine to culture       Rx Keflex       Discharge       Pt to check Sunday or Monday to see if culture positive  [facility] 04/08/2012, 11:47 AM

## 2012-04-09 LAB — URINE CULTURE: Colony Count: 7000

## 2012-06-03 ENCOUNTER — Encounter (HOSPITAL_COMMUNITY): Payer: Self-pay | Admitting: *Deleted

## 2012-06-03 ENCOUNTER — Inpatient Hospital Stay (HOSPITAL_COMMUNITY)
Admission: AD | Admit: 2012-06-03 | Discharge: 2012-06-03 | Disposition: A | Discharge status: Home / Self Care | Payer: BC Managed Care – PPO | Source: Ambulatory Visit | Attending: Obstetrics & Gynecology | Admitting: Obstetrics & Gynecology

## 2012-06-03 DIAGNOSIS — N949 Unspecified condition associated with female genital organs and menstrual cycle: Secondary | ICD-10-CM

## 2012-06-03 DIAGNOSIS — R1031 Right lower quadrant pain: Secondary | ICD-10-CM | POA: Insufficient documentation

## 2012-06-03 DIAGNOSIS — O99891 Other specified diseases and conditions complicating pregnancy: Secondary | ICD-10-CM | POA: Insufficient documentation

## 2012-06-03 LAB — URINALYSIS, ROUTINE W REFLEX MICROSCOPIC
Hgb urine dipstick: NEGATIVE
Protein, ur: NEGATIVE mg/dL
Urobilinogen, UA: 0.2 mg/dL (ref 0.0–1.0)

## 2012-06-03 NOTE — MAU Note (Signed)
C/o having a sharp gradual stabbing-type pain in her R lower quadrant that was relieved by 2 Extra-strength Tylenol; denies any pain at present;

## 2012-06-03 NOTE — MAU Provider Note (Signed)
History     CSN: [id number]  Arrival date and time: 06/03/12 1505   First Provider Initiated Contact with Patient 06/03/12 1611      Chief Complaint  Patient presents with  . Abdominal Pain   HPI This is a 36 y.o. female at [redacted]w[redacted]d who presents with c/o intermittent right sided pain. Does not hurt now. Mostly occurred today. No leaking or bleeding.   RN Note: C/o having a sharp gradual stabbing-type pain in her R lower quadrant that was relieved by 2 Extra-strength Tylenol; denies any pain at present;   OB History    Grav Para Term Preterm Abortions TAB SAB Ect Mult Living   6 2 1 1 3  2 1  2       Past Medical History  Diagnosis Date  . HTN in pregnancy, chronic     Took Labetol, G3P1001 term female, no comps, miscarried, 1st trimester, failed IUD [facility]) 11/04  . Hypertension   . Obesity   . Multiple sclerosis   . Preterm labor     Past Surgical History  Procedure Date  . Gravida     4/para2/miscarriged2; preeclamptic #2 pregnancy  . Spinal tap 11/27/2008  . Mri 11/18/2008    Head, abn  . Cesarean section   . Cholecystectomy   . Salpingectomy     R tube    Family History  Problem Relation Age of Onset  . Hypertension Mother   . Heart attack Mother 37    Massive MI  . Hypertension Father   . Hyperlipidemia Father   . Diabetes Father   . Seizures Brother   . Seizures Maternal Aunt   . Heart attack Maternal Grandmother     MI  . Hypertension Maternal Grandmother   . Anesthesia problems Neg Hx     History  Substance Use Topics  . Smoking status: Never Smoker   . Smokeless tobacco: Never Used  . Alcohol Use: No    Allergies: No Known Allergies  Prescriptions prior to admission  Medication Sig Dispense Refill  . amLODipine (NORVASC) 5 MG tablet Take 5 mg by mouth daily.        Marland Kitchen aspirin EC 81 MG tablet Take 81 mg by mouth daily.      . methyldopa (ALDOMET) 250 MG tablet Take 250 mg by mouth 3 (three) times daily.      . Prenatal Vit-Fe Fumarate-FA  (PRENATAL MULTIVITAMIN) TABS Take 1 tablet by mouth at bedtime.        ROS As listed in HPI  Physical Exam   Blood pressure 138/85, pulse 84, temperature 98.4 F (36.9 C), temperature source Oral, resp. rate 16, height 5' 3.5" (1.613 m), weight 217 lb (98.431 kg), last menstrual period 01/16/2012, SpO2 100.00%.  Physical Exam  Constitutional: She is oriented to person, place, and time. She appears well-developed and well-nourished. No distress.  HENT:  Head: Normocephalic.  Cardiovascular: Normal rate.   Respiratory: Effort normal.  GI: Soft. She exhibits no distension and no mass. There is tenderness (very slight tenderness over RLQ). There is no rebound and no guarding.  Genitourinary: No vaginal discharge found.  Musculoskeletal: Normal range of motion.  Neurological: She is alert and oriented to person, place, and time.  Skin: Skin is warm and dry.  Psychiatric: She has a normal mood and affect.   Results for orders placed during the hospital encounter of 06/03/12 (from the past 72 hour(s))  URINALYSIS, ROUTINE W REFLEX MICROSCOPIC     Status: Normal  Collection Time   06/03/12  3:35 PM      Component Value Range Comment   Color, Urine YELLOW  YELLOW    APPearance CLEAR  CLEAR    Specific Gravity, Urine 1.010  1.005 - 1.030    pH 7.0  5.0 - 8.0    Glucose, UA NEGATIVE  NEGATIVE mg/dL    Hgb urine dipstick NEGATIVE  NEGATIVE    Bilirubin Urine NEGATIVE  NEGATIVE    Ketones, ur NEGATIVE  NEGATIVE mg/dL    Protein, ur NEGATIVE  NEGATIVE mg/dL    Urobilinogen, UA 0.2  0.0 - 1.0 mg/dL    Nitrite NEGATIVE  NEGATIVE    Leukocytes, UA NEGATIVE  NEGATIVE MICROSCOPIC NOT DONE ON URINES WITH NEGATIVE PROTEIN, BLOOD, LEUKOCYTES, NITRITE, OR GLUCOSE <1000 mg/dL.  Just had normal Anatomy US yesterday  MAU Course  Procedures  Assessment and Plan  A:  SIUP at [redacted]w[redacted]d       Probable Round Ligament Pain      No evidence of other pathology, no pain now P:  Discharge home.        Comfort measures      Follow up with [facility]  [facility] 06/03/2012, 4:24 PM

## 2012-06-03 NOTE — Discharge Instructions (Signed)

## 2012-06-03 NOTE — MAU Note (Signed)
Patient states she started having right lower abdominal pain this am, too Tylenol at 1430 and pain is gone now. Has been feeling baby move but not as much today. Denies any bleeding or leaking.

## 2012-09-22 ENCOUNTER — Other Ambulatory Visit: Payer: Self-pay | Admitting: Obstetrics & Gynecology

## 2012-09-22 DIAGNOSIS — O0990 Supervision of high risk pregnancy, unspecified, unspecified trimester: Secondary | ICD-10-CM

## 2012-09-23 ENCOUNTER — Ambulatory Visit (HOSPITAL_COMMUNITY)
Admission: RE | Admit: 2012-09-23 | Discharge: 2012-09-23 | Disposition: A | Discharge status: Home / Self Care | Payer: BC Managed Care – PPO | Source: Ambulatory Visit | Attending: Obstetrics & Gynecology | Admitting: Obstetrics & Gynecology

## 2012-09-23 DIAGNOSIS — O34219 Maternal care for unspecified type scar from previous cesarean delivery: Secondary | ICD-10-CM | POA: Insufficient documentation

## 2012-09-23 DIAGNOSIS — O0990 Supervision of high risk pregnancy, unspecified, unspecified trimester: Secondary | ICD-10-CM

## 2012-09-23 DIAGNOSIS — Z8751 Personal history of pre-term labor: Secondary | ICD-10-CM | POA: Insufficient documentation

## 2012-09-23 DIAGNOSIS — O139 Gestational [pregnancy-induced] hypertension without significant proteinuria, unspecified trimester: Secondary | ICD-10-CM | POA: Insufficient documentation

## 2012-09-28 ENCOUNTER — Other Ambulatory Visit: Payer: Self-pay | Admitting: Obstetrics & Gynecology

## 2012-09-28 DIAGNOSIS — O0990 Supervision of high risk pregnancy, unspecified, unspecified trimester: Secondary | ICD-10-CM

## 2012-09-28 DIAGNOSIS — O169 Unspecified maternal hypertension, unspecified trimester: Secondary | ICD-10-CM

## 2012-09-28 DIAGNOSIS — O99210 Obesity complicating pregnancy, unspecified trimester: Secondary | ICD-10-CM

## 2012-09-30 ENCOUNTER — Ambulatory Visit (HOSPITAL_COMMUNITY)
Admission: RE | Admit: 2012-09-30 | Discharge: 2012-09-30 | Disposition: A | Discharge status: Home / Self Care | Payer: BC Managed Care – PPO | Source: Ambulatory Visit | Attending: Obstetrics & Gynecology | Admitting: Obstetrics & Gynecology

## 2012-09-30 DIAGNOSIS — Z8751 Personal history of pre-term labor: Secondary | ICD-10-CM | POA: Insufficient documentation

## 2012-09-30 DIAGNOSIS — O139 Gestational [pregnancy-induced] hypertension without significant proteinuria, unspecified trimester: Secondary | ICD-10-CM | POA: Insufficient documentation

## 2012-09-30 DIAGNOSIS — O009 Unspecified ectopic pregnancy without intrauterine pregnancy: Secondary | ICD-10-CM | POA: Insufficient documentation

## 2012-09-30 DIAGNOSIS — O34219 Maternal care for unspecified type scar from previous cesarean delivery: Secondary | ICD-10-CM | POA: Insufficient documentation

## 2012-09-30 DIAGNOSIS — O169 Unspecified maternal hypertension, unspecified trimester: Secondary | ICD-10-CM

## 2012-09-30 DIAGNOSIS — O10019 Pre-existing essential hypertension complicating pregnancy, unspecified trimester: Secondary | ICD-10-CM | POA: Insufficient documentation

## 2012-09-30 DIAGNOSIS — O99210 Obesity complicating pregnancy, unspecified trimester: Secondary | ICD-10-CM

## 2012-10-05 ENCOUNTER — Other Ambulatory Visit: Payer: Self-pay | Admitting: Obstetrics & Gynecology

## 2012-10-05 DIAGNOSIS — O288 Other abnormal findings on antenatal screening of mother: Secondary | ICD-10-CM

## 2012-10-05 DIAGNOSIS — O09A0 Supervision of pregnancy with history of molar pregnancy, unspecified trimester: Secondary | ICD-10-CM

## 2012-10-07 ENCOUNTER — Encounter (HOSPITAL_COMMUNITY): Payer: Self-pay | Admitting: *Deleted

## 2012-10-07 ENCOUNTER — Inpatient Hospital Stay (HOSPITAL_COMMUNITY)
Admission: AD | Admit: 2012-10-07 | Discharge: 2012-10-07 | Disposition: A | Discharge status: Home / Self Care | Payer: BC Managed Care – PPO | Source: Ambulatory Visit | Attending: Obstetrics | Admitting: Obstetrics

## 2012-10-07 ENCOUNTER — Other Ambulatory Visit: Payer: Self-pay | Admitting: Obstetrics & Gynecology

## 2012-10-07 ENCOUNTER — Ambulatory Visit (HOSPITAL_COMMUNITY)
Admission: RE | Admit: 2012-10-07 | Discharge: 2012-10-07 | Disposition: A | Discharge status: Home / Self Care | Payer: BC Managed Care – PPO | Source: Ambulatory Visit | Attending: Obstetrics & Gynecology | Admitting: Obstetrics & Gynecology

## 2012-10-07 DIAGNOSIS — O09529 Supervision of elderly multigravida, unspecified trimester: Secondary | ICD-10-CM | POA: Insufficient documentation

## 2012-10-07 DIAGNOSIS — O288 Other abnormal findings on antenatal screening of mother: Secondary | ICD-10-CM

## 2012-10-07 DIAGNOSIS — O169 Unspecified maternal hypertension, unspecified trimester: Secondary | ICD-10-CM

## 2012-10-07 DIAGNOSIS — O368390 Maternal care for abnormalities of the fetal heart rate or rhythm, unspecified trimester, not applicable or unspecified: Secondary | ICD-10-CM | POA: Insufficient documentation

## 2012-10-07 DIAGNOSIS — O10019 Pre-existing essential hypertension complicating pregnancy, unspecified trimester: Secondary | ICD-10-CM | POA: Insufficient documentation

## 2012-10-07 NOTE — Progress Notes (Signed)
RN notified Dr. Clearance Coots of NST. Reported to him of two 10X10 accels, moderate variability for a short time, and 1-2 variables. Clearance Coots gave orders to discharge her, to follow up in the office on Monday.

## 2012-10-07 NOTE — MAU Provider Note (Signed)
History     CSN: [id number]  Arrival date and time: 10/07/12 1505   First Provider Initiated Contact with Patient 10/07/12 1556      Chief Complaint  Patient presents with  . Non-stress Test   HPI Pt is [redacted]w[redacted]d  And was seen  for scheduled for [place] and BPP. The fetal heart rate was low and was sent from ultrasound. She is hypertensive and is on labetalol and norvasc.  She has an occ ctx.  FHR runs usually 120, on [place] FHR 112. Ultrasound BPP was 8/8 She is being seen for NST. Past Medical History  Diagnosis Date  . HTN in pregnancy, chronic     Took Labetol, G3P1001 term female, no comps, miscarried, 1st trimester, failed IUD [facility]) 11/04  . Hypertension   . Obesity   . Multiple sclerosis   . Preterm labor     Past Surgical History  Procedure Date  . Gravida     4/para2/miscarriged2; preeclamptic #2 pregnancy  . Spinal tap 11/27/2008  . Mri 11/18/2008    Head, abn  . Cesarean section   . Cholecystectomy   . Salpingectomy     R tube    Family History  Problem Relation Age of Onset  . Hypertension Mother   . Heart attack Mother 37    Massive MI  . Hypertension Father   . Hyperlipidemia Father   . Diabetes Father   . Seizures Brother   . Seizures Maternal Aunt   . Heart attack Maternal Grandmother     MI  . Hypertension Maternal Grandmother   . Anesthesia problems Neg Hx     History  Substance Use Topics  . Smoking status: Never Smoker   . Smokeless tobacco: Never Used  . Alcohol Use: No    Allergies: No Known Allergies  Prescriptions prior to admission  Medication Sig Dispense Refill  . amLODipine (NORVASC) 5 MG tablet Take 5 mg by mouth daily.        Marland Kitchen aspirin EC 81 MG tablet Take 81 mg by mouth daily.      . methyldopa (ALDOMET) 250 MG tablet Take 250 mg by mouth 3 (three) times daily.      . Prenatal Vit-Fe Fumarate-FA (PRENATAL MULTIVITAMIN) TABS Take 1 tablet by mouth at bedtime.        Review of Systems  Constitutional: Negative for fever and  chills.  Respiratory: Negative for cough.   Gastrointestinal: Negative for nausea, vomiting, abdominal pain, diarrhea and constipation.  Genitourinary: Negative for dysuria.  Neurological: Negative for headaches.   Physical Exam   Blood pressure 147/83, pulse 78, temperature 98.3 F (36.8 C), temperature source Oral, resp. rate 22, last menstrual period 01/16/2012.  Physical Exam  Nursing note and vitals reviewed. Constitutional: She appears well-developed and well-nourished.  HENT:  Head: Normocephalic.  Eyes: Pupils are equal, round, and reactive to light.  Neck: Normal range of motion. Neck supple.  Cardiovascular: Normal rate.   Respiratory: Effort normal.  GI: Soft. She exhibits no distension. There is no tenderness. There is no rebound and no guarding.       FHR 112 with mild- mod variability- 2 accelerations   Musculoskeletal: Normal range of motion.  Neurological: She is alert.  Skin: Skin is warm and dry.  Psychiatric: She has a normal mood and affect.    MAU Course  Procedures Discussed with Dr. Clearance Coots- will monitor for NST  2 10x10 acc noted on NST BPP 8/8 D/c home per Dr. Clearance Coots F/u  as scheduled  Assessment and Plan  Decrease in FHR BPP 8/8 D/c home f/u with Dr. Clearance Coots as scheduled  [facility] 10/07/2012, 3:56 PM

## 2012-10-07 NOTE — MAU Note (Signed)
FHR was low compared to baseline in ultrasound this afternoon.

## 2012-10-12 ENCOUNTER — Other Ambulatory Visit: Payer: Self-pay | Admitting: Obstetrics & Gynecology

## 2012-10-13 ENCOUNTER — Encounter (HOSPITAL_COMMUNITY): Payer: Self-pay | Admitting: Anesthesiology

## 2012-10-13 ENCOUNTER — Inpatient Hospital Stay (HOSPITAL_COMMUNITY): Payer: BC Managed Care – PPO

## 2012-10-13 ENCOUNTER — Encounter (HOSPITAL_COMMUNITY): Payer: Self-pay

## 2012-10-13 ENCOUNTER — Inpatient Hospital Stay (HOSPITAL_COMMUNITY)
Admission: AD | Admit: 2012-10-13 | Discharge: 2012-10-15 | DRG: 372 | Disposition: A | Discharge status: Home / Self Care | Payer: BC Managed Care – PPO | Source: Ambulatory Visit | Attending: Obstetrics & Gynecology | Admitting: Obstetrics & Gynecology

## 2012-10-13 ENCOUNTER — Inpatient Hospital Stay (HOSPITAL_COMMUNITY): Payer: BC Managed Care – PPO | Admitting: Anesthesiology

## 2012-10-13 DIAGNOSIS — IMO0001 Reserved for inherently not codable concepts without codable children: Secondary | ICD-10-CM

## 2012-10-13 DIAGNOSIS — O09529 Supervision of elderly multigravida, unspecified trimester: Secondary | ICD-10-CM | POA: Diagnosis present

## 2012-10-13 DIAGNOSIS — O34219 Maternal care for unspecified type scar from previous cesarean delivery: Secondary | ICD-10-CM | POA: Diagnosis present

## 2012-10-13 DIAGNOSIS — O321XX0 Maternal care for breech presentation, not applicable or unspecified: Secondary | ICD-10-CM | POA: Diagnosis present

## 2012-10-13 DIAGNOSIS — R1031 Right lower quadrant pain: Secondary | ICD-10-CM

## 2012-10-13 DIAGNOSIS — O1002 Pre-existing essential hypertension complicating childbirth: Principal | ICD-10-CM | POA: Diagnosis present

## 2012-10-13 LAB — CBC
HCT: 37 % (ref 36.0–46.0)
Hemoglobin: 12.2 g/dL (ref 12.0–15.0)
MCH: 32.4 pg (ref 26.0–34.0)
MCHC: 34.6 g/dL (ref 30.0–36.0)
MCV: 93.7 fL (ref 78.0–100.0)
Platelets: 280 10*3/uL (ref 150–400)
RBC: 3.76 MIL/uL — ABNORMAL LOW (ref 3.87–5.11)
RDW: 12.9 % (ref 11.5–15.5)
WBC: 9.8 10*3/uL (ref 4.0–10.5)

## 2012-10-13 LAB — PREPARE RBC (CROSSMATCH)

## 2012-10-13 LAB — OB RESULTS CONSOLE GBS: GBS: NEGATIVE

## 2012-10-13 MED ORDER — TETANUS-DIPHTH-ACELL PERTUSSIS 5-2.5-18.5 LF-MCG/0.5 IM SUSP
0.5000 mL | Freq: Once | INTRAMUSCULAR | Status: AC
  Administered 2012-10-14: 0.5 mL via INTRAMUSCULAR
  Filled 2012-10-13: qty 0.5

## 2012-10-13 MED ORDER — DIPHENOXYLATE-ATROPINE 2.5-0.025 MG PO TABS
1.0000 | ORAL_TABLET | Freq: Once | ORAL | Status: AC
  Administered 2012-10-13: 1 via ORAL
  Filled 2012-10-13: qty 1

## 2012-10-13 MED ORDER — SENNOSIDES-DOCUSATE SODIUM 8.6-50 MG PO TABS
2.0000 | ORAL_TABLET | Freq: Every day | ORAL | Status: DC
  Administered 2012-10-13 – 2012-10-14 (×2): 2 via ORAL

## 2012-10-13 MED ORDER — ZOLPIDEM TARTRATE 5 MG PO TABS: 5.0000 mg | ORAL_TABLET | Freq: Every evening | ORAL | Status: DC | PRN

## 2012-10-13 MED ORDER — CEFAZOLIN SODIUM-DEXTROSE 2-3 GM-% IV SOLR
2.0000 g | Freq: Once | INTRAVENOUS | Status: AC
  Administered 2012-10-13: 2 g via INTRAVENOUS
  Filled 2012-10-13: qty 50

## 2012-10-13 MED ORDER — BUPIVACAINE HCL (PF) 0.25 % IJ SOLN
INTRAMUSCULAR | Status: DC | PRN
  Administered 2012-10-13: 5 mL via EPIDURAL

## 2012-10-13 MED ORDER — DIPHENHYDRAMINE HCL 25 MG PO CAPS: 25.0000 mg | ORAL_CAPSULE | Freq: Four times a day (QID) | ORAL | Status: DC | PRN

## 2012-10-13 MED ORDER — DIPHENOXYLATE-ATROPINE 2.5-0.025 MG/5ML PO LIQD: 1.0000 mL | Freq: Once | ORAL | Status: DC

## 2012-10-13 MED ORDER — IBUPROFEN 600 MG PO TABS: 600.0000 mg | ORAL_TABLET | Freq: Four times a day (QID) | ORAL | Status: DC | PRN

## 2012-10-13 MED ORDER — IBUPROFEN 600 MG PO TABS
600.0000 mg | ORAL_TABLET | Freq: Four times a day (QID) | ORAL | Status: DC
  Administered 2012-10-13 – 2012-10-15 (×6): 600 mg via ORAL
  Filled 2012-10-13 (×6): qty 1

## 2012-10-13 MED ORDER — FERROUS SULFATE 325 (65 FE) MG PO TABS
325.0000 mg | ORAL_TABLET | Freq: Two times a day (BID) | ORAL | Status: DC
  Administered 2012-10-14: 325 mg via ORAL
  Filled 2012-10-13: qty 1

## 2012-10-13 MED ORDER — LACTATED RINGERS IV SOLN
INTRAVENOUS | Status: DC
  Administered 2012-10-13 (×2): via INTRAVENOUS
  Administered 2012-10-13: 1000 mL via INTRAVENOUS

## 2012-10-13 MED ORDER — CARBOPROST TROMETHAMINE 250 MCG/ML IM SOLN
250.0000 ug | Freq: Once | INTRAMUSCULAR | Status: AC
  Administered 2012-10-13: 250 ug via INTRAMUSCULAR

## 2012-10-13 MED ORDER — ONDANSETRON HCL 4 MG/2ML IJ SOLN: 4.0000 mg | Freq: Four times a day (QID) | INTRAMUSCULAR | Status: DC | PRN

## 2012-10-13 MED ORDER — EPHEDRINE 5 MG/ML INJ
10.0000 mg | INTRAVENOUS | Status: DC | PRN
  Filled 2012-10-13: qty 4

## 2012-10-13 MED ORDER — OXYCODONE-ACETAMINOPHEN 5-325 MG PO TABS: 1.0000 | ORAL_TABLET | ORAL | Status: DC | PRN

## 2012-10-13 MED ORDER — LABETALOL HCL 300 MG PO TABS
300.0000 mg | ORAL_TABLET | Freq: Two times a day (BID) | ORAL | Status: DC
Start: 2012-10-13 — End: 2012-10-15
  Administered 2012-10-13 – 2012-10-15 (×5): 300 mg via ORAL
  Filled 2012-10-13 (×8): qty 1

## 2012-10-13 MED ORDER — OXYTOCIN BOLUS FROM INFUSION: 500.0000 mL | Freq: Once | INTRAVENOUS | Status: DC

## 2012-10-13 MED ORDER — TERBUTALINE SULFATE 1 MG/ML IJ SOLN: 0.2500 mg | Freq: Once | INTRAMUSCULAR | Status: DC | PRN

## 2012-10-13 MED ORDER — LANOLIN HYDROUS EX OINT: TOPICAL_OINTMENT | CUTANEOUS | Status: DC | PRN

## 2012-10-13 MED ORDER — WITCH HAZEL-GLYCERIN EX PADS: 1.0000 "application " | MEDICATED_PAD | CUTANEOUS | Status: DC | PRN

## 2012-10-13 MED ORDER — BENZOCAINE-MENTHOL 20-0.5 % EX AERO
1.0000 "application " | INHALATION_SPRAY | CUTANEOUS | Status: DC | PRN
  Administered 2012-10-14: 1 via TOPICAL
  Filled 2012-10-13: qty 56

## 2012-10-13 MED ORDER — EPHEDRINE 5 MG/ML INJ: 10.0000 mg | INTRAVENOUS | Status: DC | PRN

## 2012-10-13 MED ORDER — PHENYLEPHRINE 40 MCG/ML (10ML) SYRINGE FOR IV PUSH (FOR BLOOD PRESSURE SUPPORT): 80.0000 ug | PREFILLED_SYRINGE | INTRAVENOUS | Status: DC | PRN

## 2012-10-13 MED ORDER — ONDANSETRON HCL 4 MG/2ML IJ SOLN
4.0000 mg | INTRAMUSCULAR | Status: DC | PRN
  Administered 2012-10-13: 4 mg via INTRAVENOUS
  Filled 2012-10-13: qty 2

## 2012-10-13 MED ORDER — LIDOCAINE HCL (PF) 1 % IJ SOLN
INTRAMUSCULAR | Status: DC | PRN
  Administered 2012-10-13: 99 mL
  Administered 2012-10-13: 9 mL

## 2012-10-13 MED ORDER — OXYTOCIN 40 UNITS IN LACTATED RINGERS INFUSION - SIMPLE MED
62.5000 mL/h | INTRAVENOUS | Status: DC
  Filled 2012-10-13: qty 1000

## 2012-10-13 MED ORDER — PHENYLEPHRINE 40 MCG/ML (10ML) SYRINGE FOR IV PUSH (FOR BLOOD PRESSURE SUPPORT)
80.0000 ug | PREFILLED_SYRINGE | INTRAVENOUS | Status: DC | PRN
  Filled 2012-10-13: qty 5

## 2012-10-13 MED ORDER — PRENATAL MULTIVITAMIN CH
1.0000 | ORAL_TABLET | Freq: Every day | ORAL | Status: DC
  Administered 2012-10-14 – 2012-10-15 (×2): 1 via ORAL
  Filled 2012-10-13 (×2): qty 1

## 2012-10-13 MED ORDER — FENTANYL 2.5 MCG/ML BUPIVACAINE 1/10 % EPIDURAL INFUSION (WH - ANES)
INTRAMUSCULAR | Status: DC | PRN
  Administered 2012-10-13: 14 mL/h via EPIDURAL

## 2012-10-13 MED ORDER — ACETAMINOPHEN 325 MG PO TABS: 650.0000 mg | ORAL_TABLET | ORAL | Status: DC | PRN

## 2012-10-13 MED ORDER — OXYTOCIN 40 UNITS IN LACTATED RINGERS INFUSION - SIMPLE MED
125.0000 mL/h | INTRAVENOUS | Status: AC
  Filled 2012-10-13 (×2): qty 1000

## 2012-10-13 MED ORDER — MAGNESIUM HYDROXIDE 400 MG/5ML PO SUSP: 30.0000 mL | ORAL | Status: DC | PRN

## 2012-10-13 MED ORDER — LACTATED RINGERS IV SOLN
500.0000 mL | Freq: Once | INTRAVENOUS | Status: AC
  Administered 2012-10-13: 500 mL via INTRAVENOUS

## 2012-10-13 MED ORDER — DIPHENHYDRAMINE HCL 50 MG/ML IJ SOLN: 12.5000 mg | INTRAMUSCULAR | Status: DC | PRN

## 2012-10-13 MED ORDER — LIDOCAINE HCL (PF) 1 % IJ SOLN
30.0000 mL | INTRAMUSCULAR | Status: DC | PRN
  Filled 2012-10-13: qty 30

## 2012-10-13 MED ORDER — FLEET ENEMA 7-19 GM/118ML RE ENEM: 1.0000 | ENEMA | RECTAL | Status: DC | PRN

## 2012-10-13 MED ORDER — MEASLES, MUMPS & RUBELLA VAC ~~LOC~~ INJ: 0.5000 mL | INJECTION | Freq: Once | SUBCUTANEOUS | Status: DC

## 2012-10-13 MED ORDER — CITRIC ACID-SODIUM CITRATE 334-500 MG/5ML PO SOLN
30.0000 mL | ORAL | Status: DC | PRN
  Filled 2012-10-13: qty 15

## 2012-10-13 MED ORDER — DIBUCAINE 1 % RE OINT: 1.0000 "application " | TOPICAL_OINTMENT | RECTAL | Status: DC | PRN

## 2012-10-13 MED ORDER — AMLODIPINE BESYLATE 5 MG PO TABS
5.0000 mg | ORAL_TABLET | Freq: Every day | ORAL | Status: DC
  Administered 2012-10-13 – 2012-10-15 (×3): 5 mg via ORAL
  Filled 2012-10-13 (×4): qty 1

## 2012-10-13 MED ORDER — FENTANYL 2.5 MCG/ML BUPIVACAINE 1/10 % EPIDURAL INFUSION (WH - ANES)
14.0000 mL/h | INTRAMUSCULAR | Status: DC
  Administered 2012-10-13: 14 mL/h via EPIDURAL
  Filled 2012-10-13 (×3): qty 125

## 2012-10-13 MED ORDER — ONDANSETRON HCL 4 MG PO TABS: 4.0000 mg | ORAL_TABLET | ORAL | Status: DC | PRN

## 2012-10-13 MED ORDER — OXYTOCIN 40 UNITS IN LACTATED RINGERS INFUSION - SIMPLE MED
1.0000 m[IU]/min | INTRAVENOUS | Status: DC
  Administered 2012-10-13: 2 m[IU]/min via INTRAVENOUS
  Administered 2012-10-13: 1 m[IU]/min via INTRAVENOUS

## 2012-10-13 MED ORDER — LACTATED RINGERS IV SOLN: 500.0000 mL | INTRAVENOUS | Status: DC | PRN

## 2012-10-13 NOTE — Anesthesia Postprocedure Evaluation (Signed)
Anesthesia Post Note  Patient: Angel Phelps  Procedure(s) Performed: * No procedures listed *  Anesthesia type: Epidural  Patient location: Mother/Baby  Post pain: Pain level controlled  Post assessment: Post-op Vital signs reviewed  Last Vitals:  Filed Vitals:   10/13/12 1840  BP: 143/95  Pulse: 84  Temp: 36.6 C  Resp: 20    Post vital signs: Reviewed  Level of consciousness:alert  Complications: No apparent anesthesia complications

## 2012-10-13 NOTE — Progress Notes (Signed)
Dr. Tamela Oddi notified about pt home meds.  Requesting orders to continue BP meds.  Informed of SVE, FH tracing, UC pattern.  Orders to start pitocin.

## 2012-10-13 NOTE — Anesthesia Preprocedure Evaluation (Signed)
Anesthesia Evaluation  Patient identified by MRN, date of birth, ID band Patient awake    Reviewed: Allergy & Precautions, H&P , NPO status , Patient's Chart, lab work & pertinent test results, reviewed documented beta blocker date and time   Airway Mallampati: II TM Distance: >3 FB Neck ROM: full    Dental No notable dental hx.    Pulmonary neg pulmonary ROS,    Pulmonary exam normal       Cardiovascular hypertension, Pt. on home beta blockers and Pt. on medications     Neuro/Psych  Neuromuscular disease    GI/Hepatic negative GI ROS, Neg liver ROS,   Endo/Other  Morbid obesity  Renal/GU negative Renal ROS  negative genitourinary   Musculoskeletal negative musculoskeletal ROS (+)   Abdominal (+) + obese,   Peds negative pediatric ROS (+)  Hematology negative hematology ROS (+)   Anesthesia Other Findings   Reproductive/Obstetrics (+) Pregnancy                           Anesthesia Physical Anesthesia Plan  ASA: III  Anesthesia Plan: Epidural   Post-op Pain Management:    Induction:   Airway Management Planned:   Additional Equipment:   Intra-op Plan:   Post-operative Plan:   Informed Consent: I have reviewed the patients History and Physical, chart, labs and discussed the procedure including the risks, benefits and alternatives for the proposed anesthesia with the patient or authorized representative who has indicated his/her understanding and acceptance.     Plan Discussed with:   Anesthesia Plan Comments:         Anesthesia Quick Evaluation

## 2012-10-13 NOTE — Anesthesia Procedure Notes (Signed)
Epidural Patient location during procedure: OB Start time: 10/13/2012 3:41 AM End time: 10/13/2012 3:46 AM  Staffing Anesthesiologist: Sandrea Hughs Performed by: anesthesiologist   Preanesthetic Checklist Completed: patient identified, site marked, surgical consent, pre-op evaluation, timeout performed, IV checked, risks and benefits discussed and monitors and equipment checked  Epidural Patient position: sitting Prep: site prepped and draped and DuraPrep Patient monitoring: continuous pulse ox and blood pressure Approach: midline Injection technique: LOR air  Needle:  Needle type: Tuohy  Needle gauge: 17 G Needle length: 9 cm and 9 Needle insertion depth: 7 cm Catheter type: closed end flexible Catheter size: 19 Gauge Catheter at skin depth: 12 cm Test dose: negative and Other  Assessment Sensory level: T8 Events: blood not aspirated, injection not painful, no injection resistance, negative IV test and no paresthesia  Additional Notes Reason for block:procedure for pain

## 2012-10-13 NOTE — H&P (Signed)
Angel Phelps is a 36 y.o. female presenting for contractions. Maternal Medical History:  Reason for admission: Reason for admission: contractions.  Contractions: Frequency: regular.    Fetal activity: Perceived fetal activity is normal.    Prenatal complications: Hypertension.     OB History    Grav Para Term Preterm Abortions TAB SAB Ect Mult Living   6 2 1 1 3  2 1  2      Past Medical History  Diagnosis Date  . HTN in pregnancy, chronic     Took Labetol, G3P1001 term female, no comps, miscarried, 1st trimester, failed IUD [facility]) 11/04  . Hypertension   . Obesity   . Multiple sclerosis   . Preterm labor    Past Surgical History  Procedure Date  . Gravida     4/para2/miscarriged2; preeclamptic #2 pregnancy  . Spinal tap 11/27/2008  . Mri 11/18/2008    Head, abn  . Cesarean section   . Cholecystectomy   . Salpingectomy     R tube   Family History: family history includes Diabetes in her father; Heart attack in her maternal grandmother; Heart attack (age of onset:37) in her mother; Hyperlipidemia in her father; Hypertension in her father, maternal grandmother, and mother; and Seizures in her brother and maternal aunt.  There is no history of Anesthesia problems. Social History:  reports that she has never smoked. She has never used smokeless tobacco. She reports that she does not drink alcohol or use illicit drugs.    Review of Systems  Constitutional: Negative for fever.  Eyes: Negative for blurred vision.  Respiratory: Negative for shortness of breath.   Gastrointestinal: Negative for vomiting.  Skin: Negative for rash.  Neurological: Negative for headaches.    Dilation: 5 Effacement (%): 90 Station: -3 Exam by:: C. Soliz Blood pressure 156/75, pulse 86, temperature 98.8 F (37.1 C), temperature source Oral, resp. rate 20, height 5\' 3"  (1.6 m), weight 103.874 kg (229 lb), last menstrual period 01/16/2012, SpO2 99.00%. Maternal Exam:  Abdomen: Patient reports  no abdominal tenderness. Introitus: not evaluated.   Cervix: Cervix evaluated by digital exam.     Fetal Exam Fetal Monitor Review: Variability: moderate (6-25 bpm).   Pattern: no decelerations and no accelerations.    Fetal State Assessment: Category I - tracings are normal.     Physical Exam  Constitutional: She appears well-developed.  HENT:  Head: Normocephalic.  Neck: Neck supple. No thyromegaly present.  Cardiovascular: Normal rate and regular rhythm.   Respiratory: Breath sounds normal.  GI: Soft. Bowel sounds are normal.  Skin: No rash noted.    Prenatal labs: ABO, Rh:   Antibody: Negative (02/26 0000) Rubella: Immune (02/26 0000) RPR: Nonreactive (02/26 0000)  HBsAg: Negative (02/26 0000)  HIV: Non-reactive (02/26 0000)  GBS: Negative (10/31 0000)   Assessment/Plan: Multipara @ [redacted]w[redacted]d.   H/O a previous documented LFE C/D.  FHT minimally reactive; fetal testing reassuring; AGA fetus Chronic HTN--controlled  Admit Monitor progress    JACKSON-MOORE,Lindley Stachnik A 10/13/2012, 8:18 AM

## 2012-10-13 NOTE — Progress Notes (Signed)
Contacted Dr. Clearance Coots due to order for pitocin infusing at 125 ml/hr. Order is correct and is to be infusing until morning at 125 ml/hr.

## 2012-10-13 NOTE — Progress Notes (Signed)
GC/C NOT DONE

## 2012-10-13 NOTE — MAU Note (Signed)
Contractions, denies bleeding or ROM 

## 2012-10-14 ENCOUNTER — Ambulatory Visit (HOSPITAL_COMMUNITY): Admission: RE | Admit: 2012-10-14 | Payer: BC Managed Care – PPO | Source: Ambulatory Visit

## 2012-10-14 ENCOUNTER — Encounter (HOSPITAL_COMMUNITY): Payer: Self-pay | Admitting: *Deleted

## 2012-10-14 LAB — CBC
Hemoglobin: 10.9 g/dL — ABNORMAL LOW (ref 12.0–15.0)
MCH: 32 pg (ref 26.0–34.0)
MCHC: 34.4 g/dL (ref 30.0–36.0)
MCV: 93 fL (ref 78.0–100.0)
Platelets: 218 10*3/uL (ref 150–400)
RBC: 3.41 MIL/uL — ABNORMAL LOW (ref 3.87–5.11)

## 2012-10-14 NOTE — Progress Notes (Signed)
Post Partum Day 1 Subjective: no complaints  Objective: Blood pressure 143/77, pulse 77, temperature 98 F (36.7 C), temperature source Oral, resp. rate 18, height 5\' 3"  (1.6 m), weight 103.874 kg (229 lb), last menstrual period 01/16/2012, SpO2 96.00%.  Physical Exam:  General: alert and no distress Lochia: appropriate Uterine Fundus: firm Incision: none DVT Evaluation: No evidence of DVT seen on physical exam.   Basename 10/14/12 0525 10/13/12 1710  HGB 10.9* 12.2  HCT 31.7* 35.0*    Assessment/Plan: Plan for discharge tomorrow   LOS: 1 day   HARPER,CHARLES A 10/14/2012, 8:39 AM

## 2012-10-14 NOTE — Progress Notes (Signed)
Called Dr. Clearance Coots in regards to stopping pitocin infusion, removing foley and removing epidural catheter.  Dr. Clearance Coots stated that patient was doing well this morning and everything could be removed.  Will continue to monitor patient.  Earl Gala, Linda Hedges [place]

## 2012-10-15 NOTE — Discharge Summary (Signed)
Obstetric Discharge Summary Reason for Admission: onset of labor Prenatal Procedures: none Intrapartum Procedures: spontaneous vaginal delivery Postpartum Procedures: none Complications-Operative and Postpartum: none Hemoglobin  Date Value Range Status  10/14/2012 10.9* 12.0 - 15.0 g/dL Final     HCT  Date Value Range Status  10/14/2012 31.7* 36.0 - 46.0 % Final    Physical Exam:  General: alert Lochia: appropriate Uterine Fundus: firm Incision: healing well DVT Evaluation: No evidence of DVT seen on physical exam.  Discharge Diagnoses: Term Pregnancy-delivered  Discharge Information: Date: 10/15/2012 Activity: pelvic rest Diet: routine Medications: Percocet Condition: stable Instructions: refer to practice specific booklet Discharge to: home Follow-up Information    Call in 6 weeks to follow up.         Newborn Data: Live born female  Birth Weight: 6 lb 14.4 oz (3130 g) APGAR: 8, 9  Home with mother.  Noreene Boreman A 10/15/2012, 6:51 AM

## 2012-10-16 LAB — TYPE AND SCREEN
ABO/RH(D): B POS
Unit division: 0

## 2012-10-17 ENCOUNTER — Inpatient Hospital Stay (HOSPITAL_COMMUNITY): Admission: RE | Admit: 2012-10-17 | Payer: BC Managed Care – PPO | Source: Ambulatory Visit

## 2012-11-16 ENCOUNTER — Ambulatory Visit (HOSPITAL_COMMUNITY): Payer: BC Managed Care – PPO

## 2013-03-15 ENCOUNTER — Other Ambulatory Visit: Payer: Self-pay | Admitting: *Deleted

## 2013-03-16 MED ORDER — AMLODIPINE BESY-BENAZEPRIL HCL 5-10 MG PO CAPS: 1.0000 | ORAL_CAPSULE | Freq: Every day | ORAL | Status: DC

## 2013-03-16 NOTE — Telephone Encounter (Signed)
Make sure pt has f/u w/primary care provider

## 2013-03-22 NOTE — Telephone Encounter (Signed)
Pt states she has followed up with her primary care physician in regards to her blood pressure. He is managing her blood pressure now.

## 2013-07-17 ENCOUNTER — Other Ambulatory Visit (INDEPENDENT_AMBULATORY_CARE_PROVIDER_SITE_OTHER): Payer: BC Managed Care – PPO

## 2013-07-17 VITALS — BP 147/92 | HR 76 | Temp 98.5°F | Wt 205.0 lb

## 2013-07-17 DIAGNOSIS — Z3201 Encounter for pregnancy test, result positive: Secondary | ICD-10-CM

## 2013-07-17 NOTE — Progress Notes (Unsigned)
Pt in office today for a pregnancy. Pt states she has had a positive home pregnancy test.   Pregnancy test in office is positive. Pt instructed to continue prenatal vitamins and to schedule a NOB appointment.

## 2013-07-19 ENCOUNTER — Other Ambulatory Visit: Payer: BC Managed Care – PPO

## 2013-07-19 DIAGNOSIS — Z3201 Encounter for pregnancy test, result positive: Secondary | ICD-10-CM

## 2013-07-21 ENCOUNTER — Other Ambulatory Visit: Payer: Self-pay | Admitting: Obstetrics & Gynecology

## 2013-07-21 DIAGNOSIS — Z3201 Encounter for pregnancy test, result positive: Secondary | ICD-10-CM

## 2013-07-21 DIAGNOSIS — Z3481 Encounter for supervision of other normal pregnancy, first trimester: Secondary | ICD-10-CM

## 2013-07-24 ENCOUNTER — Ambulatory Visit (HOSPITAL_COMMUNITY)
Admission: RE | Admit: 2013-07-24 | Discharge: 2013-07-24 | Disposition: A | Discharge status: Home / Self Care | Payer: BC Managed Care – PPO | Source: Ambulatory Visit | Attending: Obstetrics & Gynecology | Admitting: Obstetrics & Gynecology

## 2013-07-24 DIAGNOSIS — Z3481 Encounter for supervision of other normal pregnancy, first trimester: Secondary | ICD-10-CM

## 2013-07-24 DIAGNOSIS — Z3201 Encounter for pregnancy test, result positive: Secondary | ICD-10-CM

## 2013-07-24 DIAGNOSIS — O09529 Supervision of elderly multigravida, unspecified trimester: Secondary | ICD-10-CM | POA: Insufficient documentation

## 2013-07-24 DIAGNOSIS — Z3689 Encounter for other specified antenatal screening: Secondary | ICD-10-CM | POA: Insufficient documentation

## 2013-07-24 DIAGNOSIS — O34219 Maternal care for unspecified type scar from previous cesarean delivery: Secondary | ICD-10-CM | POA: Insufficient documentation

## 2013-07-25 ENCOUNTER — Other Ambulatory Visit: Payer: Self-pay | Admitting: *Deleted

## 2013-07-25 DIAGNOSIS — Z3481 Encounter for supervision of other normal pregnancy, first trimester: Secondary | ICD-10-CM

## 2013-07-28 ENCOUNTER — Other Ambulatory Visit: Payer: Self-pay | Admitting: *Deleted

## 2013-07-28 MED ORDER — ONDANSETRON 8 MG PO TBDP: 8.0000 mg | ORAL_TABLET | Freq: Three times a day (TID) | ORAL | Status: DC | PRN

## 2013-08-01 ENCOUNTER — Encounter (HOSPITAL_COMMUNITY): Payer: Self-pay

## 2013-08-01 ENCOUNTER — Encounter: Payer: Self-pay | Admitting: Obstetrics

## 2013-08-01 ENCOUNTER — Ambulatory Visit (HOSPITAL_COMMUNITY)
Admission: RE | Admit: 2013-08-01 | Discharge: 2013-08-01 | Disposition: A | Discharge status: Home / Self Care | Payer: BC Managed Care – PPO | Source: Ambulatory Visit | Attending: Obstetrics & Gynecology | Admitting: Obstetrics & Gynecology

## 2013-08-01 DIAGNOSIS — Z3481 Encounter for supervision of other normal pregnancy, first trimester: Secondary | ICD-10-CM

## 2013-08-01 DIAGNOSIS — O34219 Maternal care for unspecified type scar from previous cesarean delivery: Secondary | ICD-10-CM | POA: Insufficient documentation

## 2013-08-01 DIAGNOSIS — O10019 Pre-existing essential hypertension complicating pregnancy, unspecified trimester: Secondary | ICD-10-CM | POA: Insufficient documentation

## 2013-08-01 DIAGNOSIS — Z8751 Personal history of pre-term labor: Secondary | ICD-10-CM | POA: Insufficient documentation

## 2013-08-01 DIAGNOSIS — O09529 Supervision of elderly multigravida, unspecified trimester: Secondary | ICD-10-CM | POA: Insufficient documentation

## 2013-08-01 DIAGNOSIS — E669 Obesity, unspecified: Secondary | ICD-10-CM | POA: Insufficient documentation

## 2013-08-01 DIAGNOSIS — O09299 Supervision of pregnancy with other poor reproductive or obstetric history, unspecified trimester: Secondary | ICD-10-CM | POA: Insufficient documentation

## 2013-08-16 ENCOUNTER — Encounter: Payer: Self-pay | Admitting: Obstetrics & Gynecology

## 2013-08-16 ENCOUNTER — Ambulatory Visit (INDEPENDENT_AMBULATORY_CARE_PROVIDER_SITE_OTHER): Payer: BC Managed Care – PPO | Admitting: Obstetrics & Gynecology

## 2013-08-16 DIAGNOSIS — O09891 Supervision of other high risk pregnancies, first trimester: Secondary | ICD-10-CM

## 2013-08-16 DIAGNOSIS — O09899 Supervision of other high risk pregnancies, unspecified trimester: Secondary | ICD-10-CM | POA: Insufficient documentation

## 2013-08-16 DIAGNOSIS — IMO0002 Reserved for concepts with insufficient information to code with codable children: Secondary | ICD-10-CM | POA: Insufficient documentation

## 2013-08-16 DIAGNOSIS — Z113 Encounter for screening for infections with a predominantly sexual mode of transmission: Secondary | ICD-10-CM

## 2013-08-16 DIAGNOSIS — Z3481 Encounter for supervision of other normal pregnancy, first trimester: Secondary | ICD-10-CM

## 2013-08-16 DIAGNOSIS — Z98891 History of uterine scar from previous surgery: Secondary | ICD-10-CM | POA: Insufficient documentation

## 2013-08-16 DIAGNOSIS — Z3201 Encounter for pregnancy test, result positive: Secondary | ICD-10-CM

## 2013-08-16 DIAGNOSIS — Z641 Problems related to multiparity: Secondary | ICD-10-CM | POA: Insufficient documentation

## 2013-08-16 LAB — POCT URINALYSIS DIPSTICK
Bilirubin, UA: NEGATIVE
Blood, UA: NEGATIVE
Glucose, UA: NEGATIVE
Ketones, UA: NEGATIVE
Nitrite, UA: NEGATIVE

## 2013-08-16 NOTE — Progress Notes (Signed)
Pulse- 73 . Subjective:    Angel Phelps is being seen today for her first obstetrical visit.  This is not a planned pregnancy. She is at [redacted]w[redacted]d gestation. Her obstetrical history is significant for pre-eclampsia. Relationship with FOB: spouse, living together. Patient does intend to breast feed. Pregnancy history fully reviewed.  Menstrual History: OB History   Grav Para Term Preterm Abortions TAB SAB Ect Mult Living   7 3 2 1 3  2 1  4       Menarche age: 56 Patient's last menstrual period was 06/10/2013.    The following portions of the patient's history were reviewed and updated as appropriate: allergies, current medications, past family history, past medical history, past social history, past surgical history and problem list.  Review of Systems Pertinent items are noted in HPI.    Objective:   General Appearance:    Alert, cooperative, no distress, appears stated age  Head:    Normocephalic, without obvious abnormality, atraumatic  Eyes:    PERRL, conjunctiva/corneas clear, EOM's intact, fundi    benign, both eyes  Ears:    Normal TM's and external ear canals, both ears  Nose:   Nares normal, septum midline, mucosa normal, no drainage    or sinus tenderness  Throat:   Lips, mucosa, and tongue normal; teeth and gums normal  Neck:   Supple, symmetrical, trachea midline, no adenopathy;    thyroid:  no enlargement/tenderness/nodules; no carotid   bruit or JVD  Back:     Symmetric, no curvature, ROM normal, no CVA tenderness  Lungs:     Clear to auscultation bilaterally, respirations unlabored  Chest Wall:    No tenderness or deformity   Heart:    Regular rate and rhythm, S1 and S2 normal, no murmur, rub   or gallop  Breast Exam:    No tenderness, masses, or nipple abnormality  Abdomen:     Soft, non-tender, bowel sounds active all four quadrants,    no masses, no organomegaly  Genitalia:    Normal female without lesion, discharge or tenderness  Extremities:   Extremities  normal, atraumatic, no cyanosis or edema  Pulses:   2+ and symmetric all extremities  Skin:   Skin color, texture, turgor normal, no rashes or lesions  Lymph nodes:   Cervical, supraclavicular, and axillary nodes normal  Neurologic:   CNII-XII intact, normal strength, sensation and reflexes    throughout    Assessment:    Pregnancy at [redacted]w[redacted]d weeks    Plan:  NIPT discussed, educational materials given  Initial labs drawn. Prenatal vitamins.  Counseling provided regarding continued use of seat belts, cessation of alcohol consumption, smoking or use of illicit drugs; infection precautions i.e., influenza/TDAP immunizations, toxoplasmosis,CMV, parvovirus, listeria and varicella; workplace safety, exercise during pregnancy; routine dental care, safe medications, sexual activity, hot tubs, saunas, pools, travel, caffeine use, fish and methlymercury, potential toxins, hair treatments, varicose veins Weight gain recommendations reviewed: obese/BMI >30->gain  11 - 20 lbs Problem list reviewed and updated. Amniocentesis discussed Referral to MFM Follow up in 4 weeks. 50% of 20 min visit spent on counseling and coordination of care.

## 2013-08-16 NOTE — Progress Notes (Signed)
Cardiac activity on U/S. 

## 2013-08-16 NOTE — Patient Instructions (Signed)

## 2013-08-17 LAB — GC/CHLAMYDIA PROBE AMP: GC Probe RNA: NEGATIVE

## 2013-08-29 ENCOUNTER — Institutional Professional Consult (permissible substitution): Payer: BC Managed Care – PPO

## 2013-09-07 ENCOUNTER — Encounter: Payer: Self-pay | Admitting: Obstetrics & Gynecology

## 2013-09-09 ENCOUNTER — Encounter: Payer: Self-pay | Admitting: Obstetrics & Gynecology

## 2013-09-09 DIAGNOSIS — O09299 Supervision of pregnancy with other poor reproductive or obstetric history, unspecified trimester: Secondary | ICD-10-CM | POA: Insufficient documentation

## 2013-09-11 ENCOUNTER — Other Ambulatory Visit: Payer: BC Managed Care – PPO

## 2013-09-11 DIAGNOSIS — O09291 Supervision of pregnancy with other poor reproductive or obstetric history, first trimester: Secondary | ICD-10-CM

## 2013-09-11 DIAGNOSIS — IMO0001 Reserved for inherently not codable concepts without codable children: Secondary | ICD-10-CM

## 2013-09-12 LAB — PROTEIN, URINE, 24 HOUR: Protein, 24H Urine: 495 mg/d — ABNORMAL HIGH (ref 50–100)

## 2013-09-12 LAB — CREATININE CLEARANCE, URINE, 24 HOUR
Creatinine, 24H Ur: 2054 mg/d — ABNORMAL HIGH (ref 700–1800)
Creatinine, Urine: 136.9 mg/dL

## 2013-09-13 ENCOUNTER — Ambulatory Visit (INDEPENDENT_AMBULATORY_CARE_PROVIDER_SITE_OTHER): Payer: BC Managed Care – PPO | Admitting: Obstetrics & Gynecology

## 2013-09-13 VITALS — BP 142/91 | Wt 202.0 lb

## 2013-09-13 DIAGNOSIS — Z3480 Encounter for supervision of other normal pregnancy, unspecified trimester: Secondary | ICD-10-CM

## 2013-09-13 DIAGNOSIS — Z3481 Encounter for supervision of other normal pregnancy, first trimester: Secondary | ICD-10-CM

## 2013-09-13 LAB — POCT URINALYSIS DIPSTICK
Bilirubin, UA: NEGATIVE
Blood, UA: NEGATIVE
Ketones, UA: NEGATIVE
Nitrite, UA: NEGATIVE
pH, UA: 5

## 2013-09-13 NOTE — Progress Notes (Signed)
p- 78 Pt states she is having discharge with vaginal itching.

## 2013-09-18 ENCOUNTER — Encounter: Payer: Self-pay | Admitting: Obstetrics & Gynecology

## 2013-10-03 ENCOUNTER — Institutional Professional Consult (permissible substitution): Payer: BC Managed Care – PPO

## 2013-10-04 ENCOUNTER — Encounter: Payer: Self-pay | Admitting: Obstetrics & Gynecology

## 2013-10-04 ENCOUNTER — Ambulatory Visit (INDEPENDENT_AMBULATORY_CARE_PROVIDER_SITE_OTHER): Payer: BC Managed Care – PPO | Admitting: Obstetrics & Gynecology

## 2013-10-04 VITALS — BP 148/94 | Temp 99.2°F | Wt 200.0 lb

## 2013-10-04 DIAGNOSIS — Z23 Encounter for immunization: Secondary | ICD-10-CM

## 2013-10-04 DIAGNOSIS — Z3480 Encounter for supervision of other normal pregnancy, unspecified trimester: Secondary | ICD-10-CM

## 2013-10-04 NOTE — Patient Instructions (Signed)
Pregnancy - Second Trimester The second trimester of pregnancy (3 to 6 months) is a period of rapid growth for you and your baby. At the end of the sixth month, your baby is about 9 inches long and weighs 1 1/2 pounds. You will begin to feel the baby move between 18 and 20 weeks of the pregnancy. This is called quickening. Weight gain is faster. A clear fluid (colostrum) may leak out of your breasts. You may feel small contractions of the womb (uterus). This is known as false labor or Braxton-Hicks contractions. This is like a practice for labor when the baby is ready to be born. Usually, the problems with morning sickness have usually passed by the end of your first trimester. Some women develop small dark blotches (called cholasma, mask of pregnancy) on their face that usually goes away after the baby is born. Exposure to the sun makes the blotches worse. Acne may also develop in some pregnant women and pregnant women who have acne, may find that it goes away. PRENATAL EXAMS  Blood work may continue to be done during prenatal exams. These tests are done to check on your health and the probable health of your baby. Blood work is used to follow your blood levels (hemoglobin). Anemia (low hemoglobin) is common during pregnancy. Iron and vitamins are given to help prevent this. You will also be checked for diabetes between 24 and 28 weeks of the pregnancy. Some of the previous blood tests may be repeated.  The size of the uterus is measured during each visit. This is to make sure that the baby is continuing to grow properly according to the dates of the pregnancy.  Your blood pressure is checked every prenatal visit. This is to make sure you are not getting toxemia.  Your urine is checked to make sure you do not have an infection, diabetes or protein in the urine.  Your weight is checked often to make sure gains are happening at the suggested rate. This is to ensure that both you and your baby are  growing normally.  Sometimes, an ultrasound is performed to confirm the proper growth and development of the baby. This is a test which bounces harmless sound waves off the baby so your caregiver can more accurately determine due dates. Sometimes, a test is done on the amniotic fluid surrounding the baby. This test is called an amniocentesis. The amniotic fluid is obtained by sticking a needle into the belly (abdomen). This is done to check the chromosomes in instances where there is a concern about possible genetic problems with the baby. It is also sometimes done near the end of pregnancy if an early delivery is required. In this case, it is done to help make sure the baby's lungs are mature enough for the baby to live outside of the womb. CHANGES OCCURING IN THE SECOND TRIMESTER OF PREGNANCY Your body goes through many changes during pregnancy. They vary from person to person. Talk to your caregiver about changes you notice that you are concerned about.  During the second trimester, you will likely have an increase in your appetite. It is normal to have cravings for certain foods. This varies from person to person and pregnancy to pregnancy.  Your lower abdomen will begin to bulge.  You may have to urinate more often because the uterus and baby are pressing on your bladder. It is also common to get more bladder infections during pregnancy. You can help this by drinking lots of fluids   and emptying your bladder before and after intercourse.  You may begin to get stretch marks on your hips, abdomen, and breasts. These are normal changes in the body during pregnancy. There are no exercises or medicines to take that prevent this change.  You may begin to develop swollen and bulging veins (varicose veins) in your legs. Wearing support hose, elevating your feet for 15 minutes, 3 to 4 times a day and limiting salt in your diet helps lessen the problem.  Heartburn may develop as the uterus grows and  pushes up against the stomach. Antacids recommended by your caregiver helps with this problem. Also, eating smaller meals 4 to 5 times a day helps.  Constipation can be treated with a stool softener or adding bulk to your diet. Drinking lots of fluids, and eating vegetables, fruits, and whole grains are helpful.  Exercising is also helpful. If you have been very active up until your pregnancy, most of these activities can be continued during your pregnancy. If you have been less active, it is helpful to start an exercise program such as walking.  Hemorrhoids may develop at the end of the second trimester. Warm sitz baths and hemorrhoid cream recommended by your caregiver helps hemorrhoid problems.  Backaches may develop during this time of your pregnancy. Avoid heavy lifting, wear low heal shoes, and practice good posture to help with backache problems.  Some pregnant women develop tingling and numbness of their hand and fingers because of swelling and tightening of ligaments in the wrist (carpel tunnel syndrome). This goes away after the baby is born.  As your breasts enlarge, you may have to get a bigger bra. Get a comfortable, cotton, support bra. Do not get a nursing bra until the last month of the pregnancy if you will be nursing the baby.  You may get a dark line from your belly button to the pubic area called the linea nigra.  You may develop rosy cheeks because of increase blood flow to the face.  You may develop spider looking lines of the face, neck, arms, and chest. These go away after the baby is born. HOME CARE INSTRUCTIONS   It is extremely important to avoid all smoking, herbs, alcohol, and unprescribed drugs during your pregnancy. These chemicals affect the formation and growth of the baby. Avoid these chemicals throughout the pregnancy to ensure the delivery of a healthy infant.  Most of your home care instructions are the same as suggested for the first trimester of your  pregnancy. Keep your caregiver's appointments. Follow your caregiver's instructions regarding medicine use, exercise, and diet.  During pregnancy, you are providing food for you and your baby. Continue to eat regular, well-balanced meals. Choose foods such as meat, fish, milk and other low fat dairy products, vegetables, fruits, and whole-grain breads and cereals. Your caregiver will tell you of the ideal weight gain.  A physical sexual relationship may be continued up until near the end of pregnancy if there are no other problems. Problems could include early (premature) leaking of amniotic fluid from the membranes, vaginal bleeding, abdominal pain, or other medical or pregnancy problems.  Exercise regularly if there are no restrictions. Check with your caregiver if you are unsure of the safety of some of your exercises. The greatest weight gain will occur in the last 2 trimesters of pregnancy. Exercise will help you:  Control your weight.  Get you in shape for labor and delivery.  Lose weight after you have the baby.  Wear   a good support or jogging bra for breast tenderness during pregnancy. This may help if worn during sleep. Pads or tissues may be used in the bra if you are leaking colostrum.  Do not use hot tubs, steam rooms or saunas throughout the pregnancy.  Wear your seat belt at all times when driving. This protects you and your baby if you are in an accident.  Avoid raw meat, uncooked cheese, cat litter boxes, and soil used by cats. These carry germs that can cause birth defects in the baby.  The second trimester is also a good time to visit your dentist for your dental health if this has not been done yet. Getting your teeth cleaned is okay. Use a soft toothbrush. Brush gently during pregnancy.  It is easier to leak urine during pregnancy. Tightening up and strengthening the pelvic muscles will help with this problem. Practice stopping your urination while you are going to the  bathroom. These are the same muscles you need to strengthen. It is also the muscles you would use as if you were trying to stop from passing gas. You can practice tightening these muscles up 10 times a set and repeating this about 3 times per day. Once you know what muscles to tighten up, do not perform these exercises during urination. It is more likely to contribute to an infection by backing up the urine.  Ask for help if you have financial, counseling, or nutritional needs during pregnancy. Your caregiver will be able to offer counseling for these needs as well as refer you for other special needs.  Your skin may become oily. If so, wash your face with mild soap, use non-greasy moisturizer and oil or cream based makeup. MEDICINES AND DRUG USE IN PREGNANCY  Take prenatal vitamins as directed. The vitamin should contain 1 milligram of folic acid. Keep all vitamins out of reach of children. Only a couple vitamins or tablets containing iron may be fatal to a baby or young child when ingested.  Avoid use of all medicines, including herbs, over-the-counter medicines, not prescribed or suggested by your caregiver. Only take over-the-counter or prescription medicines for pain, discomfort, or fever as directed by your caregiver. Do not use aspirin.  Let your caregiver also know about herbs you may be using.  Alcohol is related to a number of birth defects. This includes fetal alcohol syndrome. All alcohol, in any form, should be avoided completely. Smoking will cause low birth rate and premature babies.  Street or illegal drugs are very harmful to the baby. They are absolutely forbidden. A baby born to an addicted mother will be addicted at birth. The baby will go through the same withdrawal an adult does. SEEK MEDICAL CARE IF:  You have any concerns or worries during your pregnancy. It is better to call with your questions if you feel they cannot wait, rather than worry about them. SEEK IMMEDIATE  MEDICAL CARE IF:   An unexplained oral temperature above 102 F (38.9 C) develops, or as your caregiver suggests.  You have leaking of fluid from the vagina (birth canal). If leaking membranes are suspected, take your temperature and tell your caregiver of this when you call.  There is vaginal spotting, bleeding, or passing clots. Tell your caregiver of the amount and how many pads are used. Light spotting in pregnancy is common, especially following intercourse.  You develop a bad smelling vaginal discharge with a change in the color from clear to white.  You continue to feel   sick to your stomach (nauseated) and have no relief from remedies suggested. You vomit blood or coffee ground-like materials.  You lose more than 2 pounds of weight or gain more than 2 pounds of weight over 1 week, or as suggested by your caregiver.  You notice swelling of your face, hands, feet, or legs.  You get exposed to German measles and have never had them.  You are exposed to fifth disease or chickenpox.  You develop belly (abdominal) pain. Round ligament discomfort is a common non-cancerous (benign) cause of abdominal pain in pregnancy. Your caregiver still must evaluate you.  You develop a bad headache that does not go away.  You develop fever, diarrhea, pain with urination, or shortness of breath.  You develop visual problems, blurry, or double vision.  You fall or are in a car accident or any kind of trauma.  There is mental or physical violence at home. Document Released: 11/24/2001 Document Revised: 08/24/2012 Document Reviewed: 05/29/2009 Exit[employer] Patient Information 2014 [employer].  

## 2013-10-04 NOTE — Progress Notes (Signed)
P 73 Patient is doing well- request PNV

## 2013-10-05 LAB — OBSTETRIC PANEL
Antibody Screen: NEGATIVE
Basophils Relative: 0 % (ref 0–1)
Eosinophils Absolute: 0.1 10*3/uL (ref 0.0–0.7)
HCT: 38 % (ref 36.0–46.0)
Hemoglobin: 13.3 g/dL (ref 12.0–15.0)
MCH: 33.2 pg (ref 26.0–34.0)
MCHC: 35 g/dL (ref 30.0–36.0)
Monocytes Absolute: 0.4 10*3/uL (ref 0.1–1.0)
Monocytes Relative: 4 % (ref 3–12)
Rh Type: POSITIVE

## 2013-10-05 LAB — VITAMIN D 25 HYDROXY (VIT D DEFICIENCY, FRACTURES): Vit D, 25-Hydroxy: 46 ng/mL (ref 30–89)

## 2013-10-05 LAB — HIV ANTIBODY (ROUTINE TESTING W REFLEX): HIV: NONREACTIVE

## 2013-10-06 LAB — HEMOGLOBINOPATHY EVALUATION
Hemoglobin Other: 0 %
Hgb A2 Quant: 3.2 % (ref 2.2–3.2)
Hgb A: 96.2 % — ABNORMAL LOW (ref 96.8–97.8)
Hgb F Quant: 0.6 % (ref 0.0–2.0)
Hgb S Quant: 0 %

## 2013-10-17 ENCOUNTER — Institutional Professional Consult (permissible substitution): Payer: BC Managed Care – PPO

## 2013-10-18 ENCOUNTER — Other Ambulatory Visit: Payer: BC Managed Care – PPO

## 2013-10-25 ENCOUNTER — Ambulatory Visit (INDEPENDENT_AMBULATORY_CARE_PROVIDER_SITE_OTHER): Payer: BC Managed Care – PPO

## 2013-10-25 DIAGNOSIS — Z363 Encounter for antenatal screening for malformations: Secondary | ICD-10-CM

## 2013-10-25 DIAGNOSIS — O09529 Supervision of elderly multigravida, unspecified trimester: Secondary | ICD-10-CM

## 2013-10-25 DIAGNOSIS — O09522 Supervision of elderly multigravida, second trimester: Secondary | ICD-10-CM

## 2013-10-25 DIAGNOSIS — Z1389 Encounter for screening for other disorder: Secondary | ICD-10-CM

## 2013-10-26 ENCOUNTER — Encounter: Payer: Self-pay | Admitting: Obstetrics & Gynecology

## 2013-10-26 LAB — US OB DETAIL + 14 WK

## 2013-11-01 ENCOUNTER — Encounter: Payer: Self-pay | Admitting: Obstetrics & Gynecology

## 2013-11-01 ENCOUNTER — Ambulatory Visit (INDEPENDENT_AMBULATORY_CARE_PROVIDER_SITE_OTHER): Payer: BC Managed Care – PPO | Admitting: Obstetrics & Gynecology

## 2013-11-01 VITALS — BP 154/83 | Temp 99.4°F | Wt 205.0 lb

## 2013-11-01 DIAGNOSIS — Z3482 Encounter for supervision of other normal pregnancy, second trimester: Secondary | ICD-10-CM

## 2013-11-01 DIAGNOSIS — Z3480 Encounter for supervision of other normal pregnancy, unspecified trimester: Secondary | ICD-10-CM

## 2013-11-01 LAB — POCT URINALYSIS DIPSTICK
Nitrite, UA: NEGATIVE
pH, UA: 6.5

## 2013-11-01 MED ORDER — TERCONAZOLE 0.4 % VA CREA: 1.0000 | TOPICAL_CREAM | Freq: Every day | VAGINAL | Status: DC

## 2013-11-01 MED ORDER — PRENATAL MULTIVITAMIN CH: 1.0000 | ORAL_TABLET | Freq: Every day | ORAL | Status: DC

## 2013-11-01 NOTE — Progress Notes (Signed)
HR - 73 Pt in office for routine OB visit, reports yellow discharge with itching to labia

## 2013-11-01 NOTE — Patient Instructions (Signed)
Second Trimester of Pregnancy The second trimester is from week 13 through week 28, months 4 through 6. The second trimester is often a time when you feel your best. Your body has also adjusted to being pregnant, and you begin to feel better physically. Usually, morning sickness has lessened or quit completely, you may have more energy, and you may have an increase in appetite. The second trimester is also a time when the fetus is growing rapidly. At the end of the sixth month, the fetus is about 9 inches long and weighs about 1 pounds. You will likely begin to feel the baby move (quickening) between 18 and 20 weeks of the pregnancy. BODY CHANGES Your body goes through many changes during pregnancy. The changes vary from woman to woman.   Your weight will continue to increase. You will notice your lower abdomen bulging out.  You may begin to get stretch marks on your hips, abdomen, and breasts.  You may develop headaches that can be relieved by medicines approved by your caregiver.  You may urinate more often because the fetus is pressing on your bladder.  You may develop or continue to have heartburn as a result of your pregnancy.  You may develop constipation because certain hormones are causing the muscles that push waste through your intestines to slow down.  You may develop hemorrhoids or swollen, bulging veins (varicose veins).  You may have back pain because of the weight gain and pregnancy hormones relaxing your joints between the bones in your pelvis and as a result of a shift in weight and the muscles that support your balance.  Your breasts will continue to grow and be tender.  Your gums may bleed and may be sensitive to brushing and flossing.  Dark spots or blotches (chloasma, mask of pregnancy) may develop on your face. This will likely fade after the baby is born.  A dark line from your belly button to the pubic area (linea nigra) may appear. This will likely fade after the  baby is born. WHAT TO EXPECT AT YOUR PRENATAL VISITS During a routine prenatal visit:  You will be weighed to make sure you and the fetus are growing normally.  Your blood pressure will be taken.  Your abdomen will be measured to track your baby's growth.  The fetal heartbeat will be listened to.  Any test results from the previous visit will be discussed. Your caregiver may ask you:  How you are feeling.  If you are feeling the baby move.  If you have had any abnormal symptoms, such as leaking fluid, bleeding, severe headaches, or abdominal cramping.  If you have any questions. Other tests that may be performed during your second trimester include:  Blood tests that check for:  Low iron levels (anemia).  Gestational diabetes (between 24 and 28 weeks).  Rh antibodies.  Urine tests to check for infections, diabetes, or protein in the urine.  An ultrasound to confirm the proper growth and development of the baby.  An amniocentesis to check for possible genetic problems.  Fetal screens for spina bifida and Down syndrome. HOME CARE INSTRUCTIONS   Avoid all smoking, herbs, alcohol, and unprescribed drugs. These chemicals affect the formation and growth of the baby.  Follow your caregiver's instructions regarding medicine use. There are medicines that are either safe or unsafe to take during pregnancy.  Exercise only as directed by your caregiver. Experiencing uterine cramps is a good sign to stop exercising.  Continue to eat regular,   healthy meals.  Wear a good support bra for breast tenderness.  Do not use hot tubs, steam rooms, or saunas.  Wear your seat belt at all times when driving.  Avoid raw meat, uncooked cheese, cat litter boxes, and soil used by cats. These carry germs that can cause birth defects in the baby.  Take your prenatal vitamins.  Try taking a stool softener (if your caregiver approves) if you develop constipation. Eat more high-fiber foods,  such as fresh vegetables or fruit and whole grains. Drink plenty of fluids to keep your urine clear or pale yellow.  Take warm sitz baths to soothe any pain or discomfort caused by hemorrhoids. Use hemorrhoid cream if your caregiver approves.  If you develop varicose veins, wear support hose. Elevate your feet for 15 minutes, 3 4 times a day. Limit salt in your diet.  Avoid heavy lifting, wear low heel shoes, and practice good posture.  Rest with your legs elevated if you have leg cramps or low back pain.  Visit your dentist if you have not gone yet during your pregnancy. Use a soft toothbrush to brush your teeth and be gentle when you floss.  A sexual relationship may be continued unless your caregiver directs you otherwise.  Continue to go to all your prenatal visits as directed by your caregiver. SEEK MEDICAL CARE IF:   You have dizziness.  You have mild pelvic cramps, pelvic pressure, or nagging pain in the abdominal area.  You have persistent nausea, vomiting, or diarrhea.  You have a bad smelling vaginal discharge.  You have pain with urination. SEEK IMMEDIATE MEDICAL CARE IF:   You have a fever.  You are leaking fluid from your vagina.  You have spotting or bleeding from your vagina.  You have severe abdominal cramping or pain.  You have rapid weight gain or loss.  You have shortness of breath with chest pain.  You notice sudden or extreme swelling of your face, hands, ankles, feet, or legs.  You have not felt your baby move in over an hour.  You have severe headaches that do not go away with medicine.  You have vision changes. Document Released: 11/24/2001 Document Revised: 08/02/2013 Document Reviewed: 01/31/2013 [employer] Patient Information 2014 [employer].  

## 2013-11-02 LAB — URINE CULTURE: Colony Count: NO GROWTH

## 2013-11-23 ENCOUNTER — Encounter: Payer: Self-pay | Admitting: Obstetrics & Gynecology

## 2013-12-04 ENCOUNTER — Encounter: Payer: BC Managed Care – PPO | Admitting: Obstetrics & Gynecology

## 2013-12-05 ENCOUNTER — Encounter: Payer: Self-pay | Admitting: Advanced Practice Midwife

## 2013-12-05 ENCOUNTER — Ambulatory Visit (INDEPENDENT_AMBULATORY_CARE_PROVIDER_SITE_OTHER): Payer: BC Managed Care – PPO | Admitting: Advanced Practice Midwife

## 2013-12-05 VITALS — BP 144/89 | Temp 98.7°F | Wt 207.0 lb

## 2013-12-05 DIAGNOSIS — Z3480 Encounter for supervision of other normal pregnancy, unspecified trimester: Secondary | ICD-10-CM

## 2013-12-05 DIAGNOSIS — Z3482 Encounter for supervision of other normal pregnancy, second trimester: Secondary | ICD-10-CM

## 2013-12-05 LAB — CBC
HCT: 34.6 % — ABNORMAL LOW (ref 36.0–46.0)
Hemoglobin: 12.5 g/dL (ref 12.0–15.0)
MCH: 34.4 pg — ABNORMAL HIGH (ref 26.0–34.0)
MCHC: 36.1 g/dL — ABNORMAL HIGH (ref 30.0–36.0)
MCV: 95.3 fL (ref 78.0–100.0)
RDW: 13.5 % (ref 11.5–15.5)

## 2013-12-05 LAB — POCT URINALYSIS DIPSTICK
Bilirubin, UA: NEGATIVE
Blood, UA: NEGATIVE
Nitrite, UA: NEGATIVE
Spec Grav, UA: 1.015
Urobilinogen, UA: NEGATIVE
pH, UA: 6.5

## 2013-12-05 NOTE — Progress Notes (Signed)
HR - 80 Pt in office today for routine OB visit, reports itching to outer pubic area, irritation to vaginal area, denies increased discharge or odor, reports pressure to low pelvic area

## 2013-12-05 NOTE — Progress Notes (Signed)
Subjective: Angel Phelps is a 37 y.o. at 24 weeks by LMP, early ultrasound  Patient denies vaginal leaking of fluid or bleeding, denies contractions.  Reports positive fetal movment.  Denies concerns today. Denies contractions, RUQ pain, vision changes.  Objective: Filed Vitals:   12/05/13 0935  BP: 144/89  Temp: 98.7 F (37.1 C)   140 FHR 24 Fundal Height Fetal Position unknown  Assessment: Patient Active Problem List   Diagnosis Date Noted  . Hx of preeclampsia, prior pregnancy, currently pregnant 09/09/2013  . Advanced maternal age (AMA) in pregnancy 08/16/2013  . [place] multipara 08/16/2013  . Previous cesarean section 08/16/2013  . Supervision of other normal pregnancy 08/16/2013  . Short interval between pregnancies complicating pregnancy, antepartum 08/16/2013  . Desires VBAC (vaginal birth after cesarean) trial 10/13/2012  . URI 05/26/2010  . DYSURIA 05/26/2010  . ANKLE PAIN, RIGHT 03/24/2010  . FATIGUE 03/24/2010  . CHEST WALL PAIN, ANTERIOR 12/18/2009  . SORE THROAT 06/06/2009  . GOITER, UNSPECIFIED 05/17/2009  . PALPITATIONS 05/17/2009  . CHEST PAIN UNSPECIFIED 05/17/2009  . PROBLEMS WITH COMMUNICATION 11/17/2008  . MULTIPLE SCLEROSIS 11/13/2008  . OBESITY 10/25/2007  . LYMPHANGITIS 10/25/2007  . SKIN RASH 10/25/2007  . HYPERTENSION 03/10/2007    Plan: Patient to return to clinic in 2 weeks [facility] MFM consult scheduled, send lab work results (Protienuria) [facility] labs today Reviewed precautions Repeat US pending Glucose test NV Reviewed warning signs in pregnancy. Patient to call with concerns PRN. Reviewed triage location.  20 min spent with patient greater than 80% spent in counseling and coordination of care.

## 2013-12-06 LAB — PROTEIN / CREATININE RATIO, URINE
Protein Creatinine Ratio: 0.29 — ABNORMAL HIGH (ref <0.15)
Total Protein, Urine: 43 mg/dL

## 2013-12-18 ENCOUNTER — Other Ambulatory Visit: Payer: Self-pay | Admitting: *Deleted

## 2013-12-18 DIAGNOSIS — O365990 Maternal care for other known or suspected poor fetal growth, unspecified trimester, not applicable or unspecified: Secondary | ICD-10-CM

## 2013-12-19 ENCOUNTER — Ambulatory Visit (INDEPENDENT_AMBULATORY_CARE_PROVIDER_SITE_OTHER): Payer: BC Managed Care – PPO

## 2013-12-19 ENCOUNTER — Other Ambulatory Visit: Payer: Self-pay | Admitting: Obstetrics & Gynecology

## 2013-12-19 ENCOUNTER — Ambulatory Visit (HOSPITAL_COMMUNITY): Admission: RE | Admit: 2013-12-19 | Payer: BC Managed Care – PPO | Source: Ambulatory Visit

## 2013-12-19 ENCOUNTER — Ambulatory Visit (INDEPENDENT_AMBULATORY_CARE_PROVIDER_SITE_OTHER): Payer: BC Managed Care – PPO | Admitting: Advanced Practice Midwife

## 2013-12-19 ENCOUNTER — Encounter: Payer: Self-pay | Admitting: Obstetrics & Gynecology

## 2013-12-19 ENCOUNTER — Other Ambulatory Visit: Payer: BC Managed Care – PPO

## 2013-12-19 ENCOUNTER — Encounter: Payer: Self-pay | Admitting: Advanced Practice Midwife

## 2013-12-19 VITALS — BP 140/81 | Temp 97.3°F | Wt 204.0 lb

## 2013-12-19 DIAGNOSIS — O365990 Maternal care for other known or suspected poor fetal growth, unspecified trimester, not applicable or unspecified: Secondary | ICD-10-CM

## 2013-12-19 DIAGNOSIS — O09529 Supervision of elderly multigravida, unspecified trimester: Secondary | ICD-10-CM

## 2013-12-19 DIAGNOSIS — O09522 Supervision of elderly multigravida, second trimester: Secondary | ICD-10-CM

## 2013-12-19 DIAGNOSIS — Z1389 Encounter for screening for other disorder: Secondary | ICD-10-CM

## 2013-12-19 DIAGNOSIS — Z3482 Encounter for supervision of other normal pregnancy, second trimester: Secondary | ICD-10-CM

## 2013-12-19 DIAGNOSIS — Z3480 Encounter for supervision of other normal pregnancy, unspecified trimester: Secondary | ICD-10-CM

## 2013-12-19 LAB — POCT URINALYSIS DIPSTICK
Bilirubin, UA: NEGATIVE
Glucose, UA: 50
Ketones, UA: NEGATIVE
LEUKOCYTES UA: NEGATIVE
NITRITE UA: NEGATIVE
PH UA: 5
Protein, UA: 100
RBC UA: NEGATIVE
Spec Grav, UA: 1.02
Urobilinogen, UA: 1

## 2013-12-19 LAB — US OB DETAIL + 14 WK

## 2013-12-19 NOTE — Progress Notes (Signed)
Pulse- 68  Pt states she is having vaginal pressure that comes and goes.

## 2013-12-19 NOTE — Progress Notes (Signed)
Routine Obstetrical Visit  Subjective:    Angel Phelps is being seen today for her routine obstetrical visit. She is at 65w4dgestation.   Patient reports no complaints. Denies HA, RUQ pain, vision change.    Objective:     BP 140/81  Temp(Src) 97.3 F (36.3 C)  Wt 204 lb (92.534 kg)  LMP 06/10/2013 Physical Exam  Exam    Assessment:    Pregnancy: [id number] Active Problem List   Diagnosis Date Noted  . Hx of preeclampsia, prior pregnancy, currently pregnant 09/09/2013  . Advanced maternal age (AMA) in pregnancy 08/16/2013  . [place] 08/16/2013  . Previous cesarean section 08/16/2013  . Supervision of other normal pregnancy 08/16/2013  . Short interval between pregnancies complicating pregnancy, antepartum 08/16/2013  . Desires VBAC (vaginal birth after cesarean) trial 10/13/2012  . URI 05/26/2010  . DYSURIA 05/26/2010  . ANKLE PAIN, RIGHT 03/24/2010  . FATIGUE 03/24/2010  . CHEST WALL PAIN, ANTERIOR 12/18/2009  . SORE THROAT 06/06/2009  . GOITER, UNSPECIFIED 05/17/2009  . PALPITATIONS 05/17/2009  . CHEST PAIN UNSPECIFIED 05/17/2009  . PROBLEMS WITH COMMUNICATION 11/17/2008  . MULTIPLE SCLEROSIS 11/13/2008  . OBESITY 10/25/2007  . LYMPHANGITIS 10/25/2007  . SKIN RASH 10/25/2007  . HYPERTENSION 03/10/2007       Plan:     Prenatal vitamins. Problem list reviewed and updated. Patient to be followed closely by MDs, may see me every other visit if she prefers.  MFM referral ASAP requested.. Follow up in 2 weeks. [place]. G[place] today.   80% of 20 min visit spent on counseling and coordination of care.     Issaih Kaus 12/19/2013

## 2013-12-20 ENCOUNTER — Encounter: Payer: Self-pay | Admitting: Obstetrics & Gynecology

## 2013-12-20 ENCOUNTER — Other Ambulatory Visit: Payer: BC Managed Care – PPO

## 2013-12-20 LAB — HIV ANTIBODY (ROUTINE TESTING W REFLEX): HIV: NONREACTIVE

## 2013-12-20 LAB — CBC
HCT: 37.4 % (ref 36.0–46.0)
Hemoglobin: 12.7 g/dL (ref 12.0–15.0)
MCH: 32.8 pg (ref 26.0–34.0)
MCHC: 34 g/dL (ref 30.0–36.0)
MCV: 96.6 fL (ref 78.0–100.0)
PLATELETS: 285 10*3/uL (ref 150–400)
RBC: 3.87 MIL/uL (ref 3.87–5.11)
RDW: 13.4 % (ref 11.5–15.5)
WBC: 9.6 10*3/uL (ref 4.0–10.5)

## 2013-12-20 LAB — GLUCOSE TOLERANCE, 2 HOURS W/ 1HR
GLUCOSE, 2 HOUR: 97 mg/dL (ref 70–139)
GLUCOSE, FASTING: 64 mg/dL — AB (ref 70–99)
Glucose, 1 hour: 102 mg/dL (ref 70–170)

## 2013-12-20 LAB — US OB DETAIL + 14 WK

## 2013-12-20 LAB — RPR

## 2013-12-26 ENCOUNTER — Institutional Professional Consult (permissible substitution): Payer: BC Managed Care – PPO

## 2013-12-26 ENCOUNTER — Ambulatory Visit (INDEPENDENT_AMBULATORY_CARE_PROVIDER_SITE_OTHER): Payer: BC Managed Care – PPO | Admitting: Advanced Practice Midwife

## 2013-12-26 VITALS — BP 145/82 | Temp 98.5°F | Wt 211.0 lb

## 2013-12-26 DIAGNOSIS — Z3480 Encounter for supervision of other normal pregnancy, unspecified trimester: Secondary | ICD-10-CM

## 2013-12-26 LAB — POCT URINALYSIS DIPSTICK
PH UA: 6
SPEC GRAV UA: 1.015

## 2013-12-26 MED ORDER — LABETALOL HCL 300 MG PO TABS: 300.0000 mg | ORAL_TABLET | Freq: Two times a day (BID) | ORAL | Status: DC

## 2013-12-26 MED ORDER — RANITIDINE HCL 150 MG PO TABS: 150.0000 mg | ORAL_TABLET | Freq: Two times a day (BID) | ORAL | Status: DC

## 2013-12-26 NOTE — Progress Notes (Signed)
Subjective: Angel Phelps is a 38 y.o. at 27 weeks.   Patient denies vaginal leaking of fluid or bleeding, denies contractions.  Reports positive fetal movment.  Patient reports GERD, would like medication. States has been taking zofran, reports constipation. Denies HA, RUQ pain or vision change.  Objective: Filed Vitals:   12/26/13 1424  BP: 145/82  Temp: 98.5 F (36.9 C)   140 FHR 27 Fundal Height Fetal Position uncertain   Assessment: Patient Active Problem List   Diagnosis Date Noted  . Hx of preeclampsia, prior pregnancy, currently pregnant 09/09/2013  . Advanced maternal age (AMA) in pregnancy 08/16/2013  . [place] multipara 08/16/2013  . Previous cesarean section 08/16/2013  . Supervision of other normal pregnancy 08/16/2013  . Short interval between pregnancies complicating pregnancy, antepartum 08/16/2013  . Desires VBAC (vaginal birth after cesarean) trial 10/13/2012  . URI 05/26/2010  . DYSURIA 05/26/2010  . ANKLE PAIN, RIGHT 03/24/2010  . FATIGUE 03/24/2010  . CHEST WALL PAIN, ANTERIOR 12/18/2009  . SORE THROAT 06/06/2009  . GOITER, UNSPECIFIED 05/17/2009  . PALPITATIONS 05/17/2009  . CHEST PAIN UNSPECIFIED 05/17/2009  . PROBLEMS WITH COMMUNICATION 11/17/2008  . MULTIPLE SCLEROSIS 11/13/2008  . OBESITY 10/25/2007  . LYMPHANGITIS 10/25/2007  . SKIN RASH 10/25/2007  . HYPERTENSION 03/10/2007    Plan: Patient to return to clinic in 2 weeks MFM consult today, recommendations given.  Plan repeat US, pending Cont to monitor BP closely. Zantac 150 mg PO Daily, may increase to BID. D/C zofran at this time. Reviewed warning signs in pregnancy. Patient to call with concerns PRN. Reviewed triage location.   20 min spent with patient greater than 80% spent in counseling and coordination of care.  Lian Pounds Roni Bread CNM

## 2013-12-26 NOTE — Progress Notes (Signed)
Pulse 79 MFM today, patient advised ulrasound every 4 weeks , weekly nsts starting at 32 weeks

## 2013-12-28 ENCOUNTER — Encounter: Payer: Self-pay | Admitting: Obstetrics & Gynecology

## 2014-01-02 ENCOUNTER — Other Ambulatory Visit: Payer: Self-pay | Admitting: *Deleted

## 2014-01-02 DIAGNOSIS — O365990 Maternal care for other known or suspected poor fetal growth, unspecified trimester, not applicable or unspecified: Secondary | ICD-10-CM

## 2014-01-09 ENCOUNTER — Encounter: Payer: Self-pay | Admitting: Obstetrics & Gynecology

## 2014-01-09 ENCOUNTER — Ambulatory Visit (HOSPITAL_COMMUNITY)
Admission: RE | Admit: 2014-01-09 | Discharge: 2014-01-09 | Disposition: A | Discharge status: Home / Self Care | Payer: BC Managed Care – PPO | Source: Ambulatory Visit | Attending: Obstetrics & Gynecology | Admitting: Obstetrics & Gynecology

## 2014-01-09 ENCOUNTER — Encounter (HOSPITAL_COMMUNITY): Payer: Self-pay

## 2014-01-09 VITALS — BP 153/93 | HR 82 | Wt 215.0 lb

## 2014-01-09 DIAGNOSIS — O09899 Supervision of other high risk pregnancies, unspecified trimester: Secondary | ICD-10-CM

## 2014-01-09 DIAGNOSIS — E669 Obesity, unspecified: Secondary | ICD-10-CM | POA: Insufficient documentation

## 2014-01-09 DIAGNOSIS — O34219 Maternal care for unspecified type scar from previous cesarean delivery: Secondary | ICD-10-CM | POA: Insufficient documentation

## 2014-01-09 DIAGNOSIS — Z3480 Encounter for supervision of other normal pregnancy, unspecified trimester: Secondary | ICD-10-CM

## 2014-01-09 DIAGNOSIS — O09299 Supervision of pregnancy with other poor reproductive or obstetric history, unspecified trimester: Secondary | ICD-10-CM | POA: Insufficient documentation

## 2014-01-09 DIAGNOSIS — Z8751 Personal history of pre-term labor: Secondary | ICD-10-CM | POA: Insufficient documentation

## 2014-01-09 DIAGNOSIS — Z98891 History of uterine scar from previous surgery: Secondary | ICD-10-CM

## 2014-01-09 DIAGNOSIS — O09529 Supervision of elderly multigravida, unspecified trimester: Secondary | ICD-10-CM | POA: Insufficient documentation

## 2014-01-09 DIAGNOSIS — IMO0002 Reserved for concepts with insufficient information to code with codable children: Secondary | ICD-10-CM

## 2014-01-09 DIAGNOSIS — Z641 Problems related to multiparity: Secondary | ICD-10-CM

## 2014-01-09 DIAGNOSIS — Z1389 Encounter for screening for other disorder: Secondary | ICD-10-CM | POA: Insufficient documentation

## 2014-01-09 DIAGNOSIS — O10019 Pre-existing essential hypertension complicating pregnancy, unspecified trimester: Secondary | ICD-10-CM | POA: Insufficient documentation

## 2014-01-09 DIAGNOSIS — I10 Essential (primary) hypertension: Secondary | ICD-10-CM

## 2014-01-09 DIAGNOSIS — O99210 Obesity complicating pregnancy, unspecified trimester: Secondary | ICD-10-CM

## 2014-01-09 DIAGNOSIS — O358XX0 Maternal care for other (suspected) fetal abnormality and damage, not applicable or unspecified: Secondary | ICD-10-CM | POA: Insufficient documentation

## 2014-01-09 DIAGNOSIS — Z363 Encounter for antenatal screening for malformations: Secondary | ICD-10-CM | POA: Insufficient documentation

## 2014-01-09 DIAGNOSIS — O365990 Maternal care for other known or suspected poor fetal growth, unspecified trimester, not applicable or unspecified: Secondary | ICD-10-CM | POA: Insufficient documentation

## 2014-01-09 LAB — US OB DETAIL + 14 WK

## 2014-01-10 ENCOUNTER — Other Ambulatory Visit: Payer: BC Managed Care – PPO

## 2014-01-12 ENCOUNTER — Encounter: Payer: BC Managed Care – PPO | Admitting: Advanced Practice Midwife

## 2014-01-12 ENCOUNTER — Ambulatory Visit (INDEPENDENT_AMBULATORY_CARE_PROVIDER_SITE_OTHER): Payer: BC Managed Care – PPO | Admitting: Obstetrics & Gynecology

## 2014-01-12 VITALS — BP 149/89 | Temp 98.4°F | Wt 212.0 lb

## 2014-01-12 DIAGNOSIS — Z3480 Encounter for supervision of other normal pregnancy, unspecified trimester: Secondary | ICD-10-CM

## 2014-01-12 LAB — POCT URINALYSIS DIPSTICK
Bilirubin, UA: NEGATIVE
Blood, UA: NEGATIVE
Glucose, UA: NEGATIVE
KETONES UA: NEGATIVE
Leukocytes, UA: NEGATIVE
Nitrite, UA: NEGATIVE
PH UA: 6
SPEC GRAV UA: 1.015
Urobilinogen, UA: NEGATIVE

## 2014-01-12 NOTE — Progress Notes (Signed)
Pulse:81 Patient is having lower abdominal and pelvic pressure. Patient denies any concerns.  VBAC calculation  81.5%

## 2014-01-18 ENCOUNTER — Encounter: Payer: BC Managed Care – PPO | Admitting: Obstetrics & Gynecology

## 2014-01-18 ENCOUNTER — Encounter: Payer: Self-pay | Admitting: *Deleted

## 2014-01-18 ENCOUNTER — Ambulatory Visit (INDEPENDENT_AMBULATORY_CARE_PROVIDER_SITE_OTHER): Payer: BC Managed Care – PPO | Admitting: Obstetrics & Gynecology

## 2014-01-18 VITALS — BP 157/96 | Temp 98.1°F | Wt 212.0 lb

## 2014-01-18 DIAGNOSIS — Z3480 Encounter for supervision of other normal pregnancy, unspecified trimester: Secondary | ICD-10-CM

## 2014-01-18 LAB — POCT URINALYSIS DIPSTICK
BILIRUBIN UA: NEGATIVE
Glucose, UA: NEGATIVE
KETONES UA: NEGATIVE
Nitrite, UA: NEGATIVE
RBC UA: NEGATIVE
Spec Grav, UA: 1.015
Urobilinogen, UA: NEGATIVE
pH, UA: 6

## 2014-01-18 MED ORDER — LABETALOL HCL 200 MG PO TABS: 400.0000 mg | ORAL_TABLET | Freq: Two times a day (BID) | ORAL | Status: DC

## 2014-01-18 NOTE — Progress Notes (Signed)
Pulse 67 Pt is having increase in lower back pressure.  Pt would like to know if we can decrease her hours at work.  Pt states that she is tired more often now.  Pt does have increased BP as well today.  Increase labetalol dosing.  Start NST next week.

## 2014-01-19 ENCOUNTER — Encounter: Payer: Self-pay | Admitting: Obstetrics & Gynecology

## 2014-01-20 NOTE — Patient Instructions (Signed)
Nonstress Test The nonstress test is a procedure that monitors the fetus's heartbeat. The test will monitor the heartbeat when the fetus is at rest and while the fetus is moving. In a healthy fetus, there will be an increase in fetal heart rate when the fetus moves or kicks. The heart rate will decrease at rest. This test helps determine if the fetus is healthy. The test may take up to a half hour. Your caregiver will look at a number of patterns in the heart rate tracing to make sure your baby is thriving. If there is concern, your caregiver may order additional tests or may suggest another course of action. This test is often done in the third trimester and can help determine if an early delivery is needed and safe. Common reasons to have this test are:  You are past your due date.  You have a high-risk pregnancy.  You are feeling less movement than normal.  You have lost a pregnancy in the past.  Your caregiver suspects fetal growth problems.  You have too much or too little amniotic fluid. BEFORE THE PROCEDURE  Eat a meal right before the test or as directed by your caregiver. Food may help stimulate fetal movements.  Use the restroom right before the test. PROCEDURE  Two belts will be placed around your abdomen. These belts have monitors attached to them. One records the fetal heart rate and the other records uterine contractions.  You may be asked to lie down on your side or to stay sitting upright.  You may be given a button to press when you feel movement.  The fetal heartbeat is listened to and watched on a screen. The heartbeat is recorded on a sheet of paper.  If the fetus seems to be sleeping, you may be asked to drink some juice or soda, gently press your abdomen, or make some noise to wake the fetus. AFTER THE PROCEDURE  Your caregiver will discuss the test results with you and make recommendations for the near future. Document Released: 11/20/2002 Document Revised:  03/27/2013 Document Reviewed: 01/03/2013 [facility] Patient Information 2014 [place], [place].

## 2014-01-24 ENCOUNTER — Ambulatory Visit (INDEPENDENT_AMBULATORY_CARE_PROVIDER_SITE_OTHER): Payer: BC Managed Care – PPO | Admitting: Obstetrics & Gynecology

## 2014-01-24 ENCOUNTER — Other Ambulatory Visit: Payer: BC Managed Care – PPO

## 2014-01-24 VITALS — BP 152/93 | Temp 97.9°F | Wt 215.0 lb

## 2014-01-24 DIAGNOSIS — Z3480 Encounter for supervision of other normal pregnancy, unspecified trimester: Secondary | ICD-10-CM

## 2014-01-24 DIAGNOSIS — O139 Gestational [pregnancy-induced] hypertension without significant proteinuria, unspecified trimester: Secondary | ICD-10-CM

## 2014-01-24 LAB — POCT URINALYSIS DIPSTICK
BILIRUBIN UA: NEGATIVE
Blood, UA: NEGATIVE
GLUCOSE UA: NEGATIVE
Ketones, UA: NEGATIVE
Leukocytes, UA: NEGATIVE
NITRITE UA: NEGATIVE
SPEC GRAV UA: 1.015
Urobilinogen, UA: NEGATIVE
pH, UA: 7

## 2014-01-24 NOTE — Progress Notes (Signed)
Pulse- 82 Patient states she is having lower abdomen pain and pressure.

## 2014-01-31 ENCOUNTER — Encounter: Payer: BC Managed Care – PPO | Admitting: Obstetrics & Gynecology

## 2014-02-05 ENCOUNTER — Ambulatory Visit (INDEPENDENT_AMBULATORY_CARE_PROVIDER_SITE_OTHER): Payer: BC Managed Care – PPO | Admitting: Obstetrics & Gynecology

## 2014-02-05 ENCOUNTER — Encounter: Payer: Self-pay | Admitting: Obstetrics & Gynecology

## 2014-02-05 VITALS — BP 155/91 | Temp 98.6°F | Wt 218.0 lb

## 2014-02-05 DIAGNOSIS — O139 Gestational [pregnancy-induced] hypertension without significant proteinuria, unspecified trimester: Secondary | ICD-10-CM

## 2014-02-05 DIAGNOSIS — Z3480 Encounter for supervision of other normal pregnancy, unspecified trimester: Secondary | ICD-10-CM

## 2014-02-05 LAB — POCT URINALYSIS DIPSTICK
Bilirubin, UA: NEGATIVE
Blood, UA: NEGATIVE
GLUCOSE UA: NEGATIVE
KETONES UA: NEGATIVE
Nitrite, UA: NEGATIVE
SPEC GRAV UA: 1.02
UROBILINOGEN UA: NEGATIVE
pH, UA: 5

## 2014-02-05 NOTE — Progress Notes (Signed)
Pulse: 77 Patient states she is having lower abdominal pressure. Patient states she is having braxton hicks. Patient states she would like to know about the fetal growth. Patient states is there a concern about the babys growth. BPP this week.

## 2014-02-06 ENCOUNTER — Other Ambulatory Visit: Payer: Self-pay | Admitting: *Deleted

## 2014-02-06 DIAGNOSIS — O365990 Maternal care for other known or suspected poor fetal growth, unspecified trimester, not applicable or unspecified: Secondary | ICD-10-CM

## 2014-02-07 ENCOUNTER — Ambulatory Visit (HOSPITAL_COMMUNITY)
Admission: RE | Admit: 2014-02-07 | Discharge: 2014-02-07 | Disposition: A | Discharge status: Home / Self Care | Payer: BC Managed Care – PPO | Source: Ambulatory Visit | Attending: Obstetrics & Gynecology | Admitting: Obstetrics & Gynecology

## 2014-02-07 ENCOUNTER — Other Ambulatory Visit: Payer: Self-pay | Admitting: *Deleted

## 2014-02-07 DIAGNOSIS — O09299 Supervision of pregnancy with other poor reproductive or obstetric history, unspecified trimester: Secondary | ICD-10-CM | POA: Insufficient documentation

## 2014-02-07 DIAGNOSIS — O288 Other abnormal findings on antenatal screening of mother: Secondary | ICD-10-CM

## 2014-02-07 DIAGNOSIS — O10019 Pre-existing essential hypertension complicating pregnancy, unspecified trimester: Secondary | ICD-10-CM | POA: Insufficient documentation

## 2014-02-07 DIAGNOSIS — O09529 Supervision of elderly multigravida, unspecified trimester: Secondary | ICD-10-CM | POA: Insufficient documentation

## 2014-02-07 DIAGNOSIS — O289 Unspecified abnormal findings on antenatal screening of mother: Secondary | ICD-10-CM | POA: Insufficient documentation

## 2014-02-07 DIAGNOSIS — Z8751 Personal history of pre-term labor: Secondary | ICD-10-CM | POA: Insufficient documentation

## 2014-02-07 DIAGNOSIS — O34219 Maternal care for unspecified type scar from previous cesarean delivery: Secondary | ICD-10-CM | POA: Insufficient documentation

## 2014-02-08 ENCOUNTER — Encounter: Payer: BC Managed Care – PPO | Admitting: Obstetrics & Gynecology

## 2014-02-12 ENCOUNTER — Ambulatory Visit (INDEPENDENT_AMBULATORY_CARE_PROVIDER_SITE_OTHER): Payer: BC Managed Care – PPO | Admitting: Obstetrics & Gynecology

## 2014-02-12 VITALS — BP 159/96 | Temp 97.9°F | Wt 222.0 lb

## 2014-02-12 DIAGNOSIS — O139 Gestational [pregnancy-induced] hypertension without significant proteinuria, unspecified trimester: Secondary | ICD-10-CM

## 2014-02-12 DIAGNOSIS — Z3480 Encounter for supervision of other normal pregnancy, unspecified trimester: Secondary | ICD-10-CM

## 2014-02-12 LAB — POCT URINALYSIS DIPSTICK
Bilirubin, UA: NEGATIVE
Blood, UA: NEGATIVE
Glucose, UA: NEGATIVE
Ketones, UA: NEGATIVE
Nitrite, UA: NEGATIVE
PH UA: 6
PROTEIN UA: NEGATIVE
SPEC GRAV UA: 1.01
Urobilinogen, UA: NEGATIVE

## 2014-02-12 MED ORDER — PANTOPRAZOLE SODIUM 40 MG PO TBEC: 40.0000 mg | DELAYED_RELEASE_TABLET | Freq: Every day | ORAL | Status: DC

## 2014-02-12 MED ORDER — LABETALOL HCL 300 MG PO TABS: 600.0000 mg | ORAL_TABLET | Freq: Two times a day (BID) | ORAL | Status: DC

## 2014-02-12 NOTE — Patient Instructions (Signed)
Sterilization Information, Female Female sterilization is a procedure to permanently prevent pregnancy. There are different ways to perform sterilization, but all either block or close the fallopian tubes so that your eggs cannot reach your uterus. If your egg cannot reach your uterus, sperm cannot fertilize the egg, and you cannot get pregnant.  Sterilization is performed by a surgical procedure. Sometimes these procedures are performed in a hospital while a patient is asleep. Sometimes they can be done in a clinic setting with the patient awake. The fallopian tubes can be surgically cut, tied, or sealed through a procedure called tubal ligation. The fallopian tubes can also be closed with clips or rings. Sterilization can also be done by placing a tiny coil into each fallopian tube, which causes scar tissue to grow inside the tube. The scar tissue then blocks the tubes.  Discuss sterilization with your caregiver to answer any concerns you or your partner may have. You may want to ask what type of sterilization your caregiver performs. Some caregivers may not perform all the various options. Sterilization is permanent and should only be done if you are sure you do not want children or do not want any more children. Having a sterilization reversed may not be successful.  STERILIZATION PROCEDURES  Laparoscopic sterilization. This is a surgical method performed at a time other than right after childbirth. Two incisions are made in the lower abdomen. A thin, lighted tube (laparoscope) is inserted into one of the incisions and is used to perform the procedure. The fallopian tubes are closed with a ring or a clip. An instrument that uses heat could be used to seal the tubes closed (electrocautery).   Mini-laparotomy. This is a surgical method done 1 or 2 days after giving birth. Typically, a small incision is made just below the belly button (umbilicus) and the fallopian tubes are exposed. The tubes can then be  sealed, tied, or cut.   Hysteroscopic sterilization. This is performed at a time other than right after childbirth. A tiny, spring-like coil is inserted through the cervix and uterus and placed into the fallopian tubes. The coil causes scaring and blocks the tubes. Other forms of contraception should be used for 3 months after the procedure to allow the scar tissue to form completely. Additionally, it is required hysterosalpingography be done 3 months later to ensure that the procedure was successful. Hysterosalpingography is a procedure that uses X-rays to look at your uterus and fallopian tubes after a material to make them show up better has been inserted. IS STERILIZATION SAFE? Sterilization is considered safe with very rare complications. Risks depend on the type of procedure you have. As with any surgical procedure, there are risks. Some risks of sterilization by any means include:   Bleeding.  Infection.  Reaction to anesthesia medicine.  Injury to surrounding organs. Risks specific to having hysteroscopic coils placed include:  The coils may not be placed correctly the first time.   The coils may move out of place.   The tubes may not get completely blocked after 3 months.   Injury to surrounding organs when placing the coil.  HOW EFFECTIVE IS FEMALE STERILIZATION? Sterilization is nearly 100% effective, but it can fail. Depending on the type of sterilization, the rate of failure can be as high as 3%. After hysteroscopic sterilization with placement of fallopian tube coils, you will need back-up birth control for 3 months after the procedure. Sterilization is effective for a lifetime.  BENEFITS OF STERILIZATION  It does  not affect your hormones, and therefore will not affect your menstrual periods, sexual desire, or performance.   It is effective for a lifetime.   It is safe.   You do not need to worry about getting pregnant. Keep in mind that if you had the  hysteroscopic placement procedure, you must wait 3 months after the procedure (or until your caregiver confirms) before pregnancy is not considered possible.   There are no side effects unlike other types of birth control (contraception).  DRAWBACKS OF STERILIZATION  You must be sure you do not want children or any more children. The procedure is permanent.   It does not provide protection against sexually transmitted infections (STIs).   The tubes can grow back together. If this happens, there is a risk of pregnancy. There is also an increased risk (50%) of pregnancy being an ectopic pregnancy. This is a pregnancy that happens outside of the uterus. Document Released: 05/18/2008 Document Revised: 05/31/2012 Document Reviewed: 03/17/2012 [facility] Patient Information 2014 [place], [place].

## 2014-02-12 NOTE — Progress Notes (Signed)
Pulse: 71 Patient states she is having lower abdominal and pelvic pressure. Patient states she needs a refill on her on her labetalol.

## 2014-02-13 ENCOUNTER — Ambulatory Visit (HOSPITAL_COMMUNITY)
Admission: RE | Admit: 2014-02-13 | Discharge: 2014-02-13 | Disposition: A | Discharge status: Home / Self Care | Payer: BC Managed Care – PPO | Source: Ambulatory Visit | Attending: Obstetrics & Gynecology | Admitting: Obstetrics & Gynecology

## 2014-02-13 DIAGNOSIS — Z8751 Personal history of pre-term labor: Secondary | ICD-10-CM | POA: Insufficient documentation

## 2014-02-13 DIAGNOSIS — O34219 Maternal care for unspecified type scar from previous cesarean delivery: Secondary | ICD-10-CM | POA: Insufficient documentation

## 2014-02-13 DIAGNOSIS — O09529 Supervision of elderly multigravida, unspecified trimester: Secondary | ICD-10-CM | POA: Insufficient documentation

## 2014-02-13 DIAGNOSIS — O10019 Pre-existing essential hypertension complicating pregnancy, unspecified trimester: Secondary | ICD-10-CM | POA: Insufficient documentation

## 2014-02-13 DIAGNOSIS — O365990 Maternal care for other known or suspected poor fetal growth, unspecified trimester, not applicable or unspecified: Secondary | ICD-10-CM

## 2014-02-13 DIAGNOSIS — O99210 Obesity complicating pregnancy, unspecified trimester: Secondary | ICD-10-CM

## 2014-02-13 DIAGNOSIS — E669 Obesity, unspecified: Secondary | ICD-10-CM | POA: Insufficient documentation

## 2014-02-13 DIAGNOSIS — O09299 Supervision of pregnancy with other poor reproductive or obstetric history, unspecified trimester: Secondary | ICD-10-CM | POA: Insufficient documentation

## 2014-02-19 ENCOUNTER — Encounter (HOSPITAL_COMMUNITY): Payer: Self-pay | Admitting: *Deleted

## 2014-02-19 ENCOUNTER — Inpatient Hospital Stay (HOSPITAL_COMMUNITY)
Admission: AD | Admit: 2014-02-19 | Discharge: 2014-02-21 | DRG: 781 | Disposition: A | Discharge status: Home / Self Care | Payer: BC Managed Care – PPO | Source: Ambulatory Visit | Attending: Obstetrics & Gynecology | Admitting: Obstetrics & Gynecology

## 2014-02-19 ENCOUNTER — Ambulatory Visit (INDEPENDENT_AMBULATORY_CARE_PROVIDER_SITE_OTHER): Payer: BC Managed Care – PPO | Admitting: Obstetrics & Gynecology

## 2014-02-19 VITALS — BP 155/89 | Temp 97.9°F | Wt 224.0 lb

## 2014-02-19 DIAGNOSIS — O9989 Other specified diseases and conditions complicating pregnancy, childbirth and the puerperium: Secondary | ICD-10-CM

## 2014-02-19 DIAGNOSIS — O139 Gestational [pregnancy-induced] hypertension without significant proteinuria, unspecified trimester: Secondary | ICD-10-CM

## 2014-02-19 DIAGNOSIS — Z641 Problems related to multiparity: Secondary | ICD-10-CM

## 2014-02-19 DIAGNOSIS — O99891 Other specified diseases and conditions complicating pregnancy: Secondary | ICD-10-CM | POA: Diagnosis present

## 2014-02-19 DIAGNOSIS — E669 Obesity, unspecified: Secondary | ICD-10-CM | POA: Diagnosis present

## 2014-02-19 DIAGNOSIS — O09899 Supervision of other high risk pregnancies, unspecified trimester: Secondary | ICD-10-CM

## 2014-02-19 DIAGNOSIS — O1400 Mild to moderate pre-eclampsia, unspecified trimester: Secondary | ICD-10-CM | POA: Diagnosis present

## 2014-02-19 DIAGNOSIS — IMO0002 Reserved for concepts with insufficient information to code with codable children: Principal | ICD-10-CM

## 2014-02-19 DIAGNOSIS — I10 Essential (primary) hypertension: Secondary | ICD-10-CM

## 2014-02-19 DIAGNOSIS — Z98891 History of uterine scar from previous surgery: Secondary | ICD-10-CM

## 2014-02-19 DIAGNOSIS — O99210 Obesity complicating pregnancy, unspecified trimester: Secondary | ICD-10-CM

## 2014-02-19 DIAGNOSIS — O09299 Supervision of pregnancy with other poor reproductive or obstetric history, unspecified trimester: Secondary | ICD-10-CM

## 2014-02-19 DIAGNOSIS — G35 Multiple sclerosis: Secondary | ICD-10-CM | POA: Diagnosis present

## 2014-02-19 DIAGNOSIS — Z3480 Encounter for supervision of other normal pregnancy, unspecified trimester: Secondary | ICD-10-CM

## 2014-02-19 LAB — COMPREHENSIVE METABOLIC PANEL
ALBUMIN: 2.7 g/dL — AB (ref 3.5–5.2)
ALT: 23 U/L (ref 0–35)
AST: 22 U/L (ref 0–37)
Alkaline Phosphatase: 130 U/L — ABNORMAL HIGH (ref 39–117)
BILIRUBIN TOTAL: 0.2 mg/dL — AB (ref 0.3–1.2)
BUN: 10 mg/dL (ref 6–23)
CHLORIDE: 102 meq/L (ref 96–112)
CO2: 21 mEq/L (ref 19–32)
Calcium: 9.6 mg/dL (ref 8.4–10.5)
Creatinine, Ser: 0.7 mg/dL (ref 0.50–1.10)
GFR calc Af Amer: >90 mL/min (ref >90)
GFR calc non Af Amer: >90 mL/min (ref >90)
Glucose, Bld: 107 mg/dL — ABNORMAL HIGH (ref 70–99)
Potassium: 3.7 mEq/L (ref 3.7–5.3)
Sodium: 135 mEq/L — ABNORMAL LOW (ref 137–147)
Total Protein: 6.6 g/dL (ref 6.0–8.3)

## 2014-02-19 LAB — POCT URINALYSIS DIPSTICK
BILIRUBIN UA: NEGATIVE
GLUCOSE UA: NEGATIVE
Ketones, UA: NEGATIVE
NITRITE UA: NEGATIVE
RBC UA: NEGATIVE
Spec Grav, UA: 1.015
UROBILINOGEN UA: NEGATIVE
pH, UA: 6

## 2014-02-19 LAB — URINALYSIS, ROUTINE W REFLEX MICROSCOPIC
BILIRUBIN URINE: NEGATIVE
GLUCOSE, UA: NEGATIVE mg/dL
Ketones, ur: NEGATIVE mg/dL
Nitrite: NEGATIVE
PH: 6.5 (ref 5.0–8.0)
Protein, ur: 30 mg/dL — AB
Specific Gravity, Urine: 1.025 (ref 1.005–1.030)
Urobilinogen, UA: 0.2 mg/dL (ref 0.0–1.0)

## 2014-02-19 LAB — URINE MICROSCOPIC-ADD ON

## 2014-02-19 LAB — TYPE AND SCREEN
ABO/RH(D): B POS
Antibody Screen: NEGATIVE

## 2014-02-19 LAB — PROTEIN / CREATININE RATIO, URINE
Creatinine, Urine: 62.49 mg/dL
PROTEIN CREATININE RATIO: 1.19 — AB (ref 0.00–0.15)
Total Protein, Urine: 74.5 mg/dL

## 2014-02-19 LAB — CBC
HCT: 38.1 % (ref 36.0–46.0)
Hemoglobin: 13.4 g/dL (ref 12.0–15.0)
MCH: 32.6 pg (ref 26.0–34.0)
MCHC: 35.2 g/dL (ref 30.0–36.0)
MCV: 92.7 fL (ref 78.0–100.0)
Platelets: 301 10*3/uL (ref 150–400)
RBC: 4.11 MIL/uL (ref 3.87–5.11)
RDW: 13.3 % (ref 11.5–15.5)
WBC: 10.8 10*3/uL — AB (ref 4.0–10.5)

## 2014-02-19 LAB — LACTATE DEHYDROGENASE: LDH: 220 U/L (ref 94–250)

## 2014-02-19 LAB — RAPID HIV SCREEN (WH-MAU): SUDS RAPID HIV SCREEN: NONREACTIVE

## 2014-02-19 MED ORDER — SODIUM CHLORIDE 0.9 % IV SOLN: 250.0000 mL | INTRAVENOUS | Status: DC | PRN

## 2014-02-19 MED ORDER — DOCUSATE SODIUM 100 MG PO CAPS
100.0000 mg | ORAL_CAPSULE | Freq: Every day | ORAL | Status: DC
  Administered 2014-02-20 – 2014-02-21 (×2): 100 mg via ORAL
  Filled 2014-02-19 (×2): qty 1

## 2014-02-19 MED ORDER — NIFEDIPINE ER OSMOTIC RELEASE 30 MG PO TB24
30.0000 mg | ORAL_TABLET | Freq: Two times a day (BID) | ORAL | Status: DC
  Administered 2014-02-19 – 2014-02-20 (×2): 30 mg via ORAL
  Filled 2014-02-19 (×4): qty 1

## 2014-02-19 MED ORDER — PRENATAL MULTIVITAMIN CH
1.0000 | ORAL_TABLET | Freq: Every day | ORAL | Status: DC
  Administered 2014-02-20 – 2014-02-21 (×2): 1 via ORAL
  Filled 2014-02-19 (×2): qty 1

## 2014-02-19 MED ORDER — SODIUM CHLORIDE 0.9 % IJ SOLN
3.0000 mL | INTRAMUSCULAR | Status: DC | PRN
  Administered 2014-02-19: 3 mL via INTRAVENOUS

## 2014-02-19 MED ORDER — ACETAMINOPHEN 325 MG PO TABS
650.0000 mg | ORAL_TABLET | ORAL | Status: DC | PRN
  Administered 2014-02-20: 650 mg via ORAL
  Filled 2014-02-19: qty 2

## 2014-02-19 MED ORDER — ZOLPIDEM TARTRATE 5 MG PO TABS: 5.0000 mg | ORAL_TABLET | Freq: Every evening | ORAL | Status: DC | PRN

## 2014-02-19 MED ORDER — LABETALOL HCL 100 MG PO TABS
600.0000 mg | ORAL_TABLET | Freq: Two times a day (BID) | ORAL | Status: DC
  Administered 2014-02-19 – 2014-02-21 (×4): 600 mg via ORAL
  Filled 2014-02-19 (×6): qty 6

## 2014-02-19 MED ORDER — CALCIUM CARBONATE ANTACID 500 MG PO CHEW: 2.0000 | CHEWABLE_TABLET | ORAL | Status: DC | PRN

## 2014-02-19 MED ORDER — PANTOPRAZOLE SODIUM 40 MG PO TBEC
40.0000 mg | DELAYED_RELEASE_TABLET | Freq: Every day | ORAL | Status: DC
  Administered 2014-02-19 – 2014-02-21 (×3): 40 mg via ORAL
  Filled 2014-02-19 (×4): qty 1

## 2014-02-19 MED ORDER — SODIUM CHLORIDE 0.9 % IJ SOLN
3.0000 mL | Freq: Two times a day (BID) | INTRAMUSCULAR | Status: DC
  Administered 2014-02-19 – 2014-02-21 (×4): 3 mL via INTRAVENOUS

## 2014-02-19 NOTE — Progress Notes (Signed)
Pulse: 67 Patient states she is having lower abdominal pain and pressure.  Proteinuria on urine dipstick.  To [facility] for admission.

## 2014-02-19 NOTE — MAU Note (Signed)
Dr Ruthann Cancer notified of pt's arrival and pt was suppose to be a direct admission.

## 2014-02-19 NOTE — MAU Note (Signed)
Pt sent over from physicians office for [facility] evaluation

## 2014-02-20 LAB — RPR: RPR Ser Ql: NONREACTIVE

## 2014-02-20 MED ORDER — BUTALBITAL-APAP-CAFFEINE 50-325-40 MG PO TABS
2.0000 | ORAL_TABLET | Freq: Four times a day (QID) | ORAL | Status: DC | PRN
  Administered 2014-02-20: 2 via ORAL
  Filled 2014-02-20: qty 2

## 2014-02-20 MED ORDER — HYDROMORPHONE HCL 2 MG PO TABS
4.0000 mg | ORAL_TABLET | Freq: Four times a day (QID) | ORAL | Status: DC | PRN
  Administered 2014-02-20 (×2): 4 mg via ORAL
  Filled 2014-02-20 (×3): qty 2

## 2014-02-20 NOTE — Patient Instructions (Signed)
Hypertension During Pregnancy Hypertension is also called high blood pressure. It can occur at any time in life and during pregnancy. When you have hypertension, there is extra pressure inside your blood vessels that carry blood from the heart to the rest of your body (arteries). Hypertension during pregnancy can cause problems for you and your baby. Your baby might not weigh as much as it should at birth or might be born early (premature). Very bad cases of hypertension during pregnancy can be life threatening.  Different types of hypertension can occur during pregnancy.   Chronic hypertension. This happens when a woman has hypertension before pregnancy and it continues during pregnancy.  Gestational hypertension. This is when hypertension develops during pregnancy.  Preeclampsia or toxemia of pregnancy. This is a very serious type of hypertension that develops only during pregnancy. It is a disease that affects the whole body (systemic) and can be very dangerous for both mother and baby.  Gestational hypertension and preeclampsia usually go away after your baby is born. Blood pressure generally stabilizes within 6 weeks. Women who have hypertension during pregnancy have a greater chance of developing hypertension later in life or with future pregnancies. RISK FACTORS Some factors make you more likely to develop hypertension during pregnancy. Risk factors include:  Having hypertension before pregnancy.  Having hypertension during a previous pregnancy.  Being overweight.  Being older than 57.  Being pregnant with more than one baby (multiples).  Having diabetes or kidney problems. SIGNS AND SYMPTOMS Chronic and gestational hypertension rarely cause symptoms. Preeclampsia has symptoms, which may include:  Increased protein in your urine. Your health care provider will check for this at every prenatal visit.  Swelling of your hands and face.  Rapid weight gain.  Headaches.  Visual  changes.  Being bothered by light.  Abdominal pain, especially in the right upper area.  Chest pain.  Shortness of breath.  Increased reflexes.  Seizures. Seizures occur with a more severe form of preeclampsia, called eclampsia. DIAGNOSIS   You may be diagnosed with hypertension during a regular prenatal exam. At each visit, tests may include:  Blood pressure checks.  A urine test to check for protein in your urine.  The type of hypertension you are diagnosed with depends on when you developed it. It also depends on your specific blood pressure reading.  Developing hypertension before 20 weeks of pregnancy is consistent with chronic hypertension.  Developing hypertension after 20 weeks of pregnancy is consistent with gestational hypertension.  Hypertension with increased urinary protein is diagnosed as preeclampsia.  Blood pressure measurements that stay above 355 systolic or 732 diastolic are a sign of severe preeclampsia. TREATMENT Treatment for hypertension during pregnancy varies. Treatment depends on the type of hypertension and how serious it is.  If you take medicine for chronic hypertension, you may need to switch medicines.  Drugs called ACE inhibitors should not be taken during pregnancy.  Low-dose aspirin may be suggested for women who have risk factors for preeclampsia.  If you have gestational hypertension, you may need to take a blood pressure medicine that is safe during pregnancy. Your health care provider will recommend the appropriate medicine.  If you have severe preeclampsia, you may need to be in the hospital. Health care providers will watch you and the baby very closely. You also may need to take medicine (magnesium sulfate) to prevent seizures and lower blood pressure.  Sometimes an early delivery is needed. This may be the case if the condition worsens. It would  be done to protect you and the baby. The only cure for preeclampsia is delivery. HOME  CARE INSTRUCTIONS  Schedule and keep all of your regular appointments for prenatal care.  Only take over-the-counter or prescription medicines as directed by your health care provider. Tell your health care provider about all medicines you take.  Eat as little salt as possible.  Get regular exercise.  Do not drink alcohol.  Do not use tobacco products.  Do not drink products with caffeine.  Lie on your left side when resting. SEEK IMMEDIATE MEDICAL CARE IF:  You have severe abdominal pain.  You have sudden swelling in the hands, ankles, or face.  You gain 4 pounds (1.8 kg) or more in 1 week.  You vomit repeatedly.  You have vaginal bleeding.  You do not feel the baby moving as much.  You have a headache.  You have blurred or double vision.  You have muscle twitching or spasms.  You have shortness of breath.  You have blue fingernails and lips.  You have blood in your urine. MAKE SURE YOU:  Understand these instructions.  Will watch your condition.  Will get help right away if you are not doing well or get worse. Document Released: 08/18/2011 Document Revised: 09/20/2013 Document Reviewed: 06/29/2013 [facility] Patient Information 2014 [place], [place].

## 2014-02-20 NOTE — Progress Notes (Signed)
02/20/14 1700  Clinical Encounter Type  Visited With Patient and family together (husband)  Visit Type Initial;Spiritual support;Social support  Referral From Nurse Bailey Mech, RN)  Spiritual Encounters  Spiritual Needs Emotional   Angel Phelps was overall in good spirits and working to maintain a positive attitude on this initial visit.  Per pt, she and her husband are very involved in church and welcome chaplain support.  Preterm delivery and NICU experience are not new to her.  Family welcomes [employer] care and looks forward to experiencing that level of encouragement and support.  Couple has three boys at home and reports support in caring for them while [place] is here.  Provided pastoral presence, reflective listening, encouragement, and intro to [facility] services.  [place], [place]

## 2014-02-20 NOTE — H&P (Signed)
Angel Phelps is a 38 y.o. female presenting for further evaluation of possible superimposed PIH/preeclampsia. Maternal Medical History:  Reason for admission: The patient presented for a routine prenatal visit was noted to have 2+ proteinuria on a urine dip.  She has a h/o chronic hypertension and has been on BASA to reduce the risk of preeclampsia.  She denies any neurological symptoms.  Fetal activity: Perceived fetal activity is normal.    Prenatal complications: PIH.   Prenatal Complications - Diabetes: none.    OB History   Grav Para Term Preterm Abortions TAB SAB Ect Mult Living   7 3 2 1 3  2 1  3      Past Medical History  Diagnosis Date  . HTN in pregnancy, chronic     Took Labetol, G3P1001 term female, no comps, miscarried, 1st trimester, failed IUD [facility]) 11/04  . Hypertension   . Obesity   . Multiple sclerosis   . Preterm labor   . Multiple sclerosis    Past Surgical History  Procedure Laterality Date  . Gravida      4/para2/miscarriged2; preeclamptic #2 pregnancy  . Spinal tap  11/27/2008  . Mri  11/18/2008    Head, abn  . Cesarean section    . Cholecystectomy    . Salpingectomy      R tube  . Dilation and curettage of uterus     Family History: family history includes Diabetes in her father; Heart attack in her maternal grandmother; Heart attack (age of onset: 70) in her mother; Hyperlipidemia in her father; Hypertension in her father, maternal grandmother, and mother; Seizures in her brother. There is no history of Anesthesia problems. Social History:  reports that she has never smoked. She has never used smokeless tobacco. She reports that she does not drink alcohol or use illicit drugs.     Review of Systems  Constitutional: Negative for fever.  Eyes: Negative for blurred vision.  Respiratory: Negative for shortness of breath.   Gastrointestinal: Negative for vomiting.  Skin: Negative for rash.  Neurological: Negative for headaches.      Blood  pressure 135/64, pulse 86, temperature 98.5 F (36.9 C), temperature source Oral, resp. rate 18, height 5' 4"  (1.626 m), weight 224 lb (101.606 kg). Maternal Exam:  Abdomen: Patient reports no abdominal tenderness. Fetal presentation: vertex  Introitus: not evaluated.   Cervix: not evaluated.   Fetal Exam Fetal Monitor Review: Variability: moderate (6-25 bpm).   Pattern: accelerations present.    Fetal State Assessment: Category I - tracings are normal.     Physical Exam  Constitutional: She appears well-developed.  HENT:  Head: Normocephalic.  Neck: Neck supple. No thyromegaly present.  Cardiovascular: Normal rate and regular rhythm.   Respiratory: Breath sounds normal.  GI: Soft. Bowel sounds are normal.  Skin: No rash noted.    Prenatal labs: ABO, Rh: --/--/B POS (03/09 2000) Antibody: NEG (03/09 2000) Rubella: 8.17 (10/22 1352) RPR: NON REACTIVE (03/09 2000)  HBsAg: NEGATIVE (10/22 1352)  HIV: NON REACTIVE (01/06 1035)  GBS:     Assessment/Plan: Multipara @ 55w4dh/o chronic hypertension, now w/r/o superimposed preeclampsia.  Admit Serial labs/NST Monitor for signs/symptoms of severe PIH   JACKSON-MOORE,Hedwig Mcfall A 02/20/2014, 10:07 AM

## 2014-02-20 NOTE — Progress Notes (Signed)
UR completed 

## 2014-02-20 NOTE — Progress Notes (Signed)
Reactive NST

## 2014-02-20 NOTE — Progress Notes (Signed)
Patient ID: [name], female   DOB: [birth year], 38 y.o.   MRN: [id number] Hospital Day: 2  S: No complaints.  O: Blood pressure 135/64, pulse 86, temperature 98.5 F (36.9 C), temperature source Oral, resp. rate 18, height 5' 4"  (1.626 m), weight 224 lb (101.606 kg).   IZT:IWPYKDXI: 130-140 bpm bpm Toco: None SVE:   A/P- 38 y.o. admitted with:  Elevated BP in office.  Stable.  Continue bedrest and observation.  Present on Admission:  **None**  Pregnancy Complications: hypertension  Preterm labor management: no treatment necessary Dating:  52w4dPNL Needed:  none FWB:  good PTL:  none

## 2014-02-21 DIAGNOSIS — O1400 Mild to moderate pre-eclampsia, unspecified trimester: Secondary | ICD-10-CM | POA: Diagnosis present

## 2014-02-21 LAB — STREP B DNA PROBE: STREP GROUP B AG: NEGATIVE

## 2014-02-21 MED ORDER — AMLODIPINE BESYLATE 2.5 MG PO TABS: 2.5000 mg | ORAL_TABLET | Freq: Every day | ORAL | Status: DC

## 2014-02-21 NOTE — Discharge Summary (Signed)
  Physician Discharge Summary  Patient ID: Angel Phelps MRN: [id number] DOB/AGE: 1976-10-26 38 y.o.  Admit date: 02/19/2014 Discharge date: 02/21/2014  Admission Diagnoses: Mild preeclampsia  Discharge Diagnoses:  Principal Problem:   Mild preeclampsia   Discharged Condition: stable  Hospital Course: The patient's blood pressures were labile 130 - 160/80/100.  Labs were remarkable for an elevated protein/creatinine ration.  There were no values consistent with a HELLP syndrome.  She reported a headache with an attempt to change from Norvasc to Procardia.  Fetal testing remained reassuring  Consults: None  Significant Diagnostic Studies: see above  Treatments: bed rest  Discharge Exam: Blood pressure 140/80, pulse 80, temperature 98.1 F (36.7 C), temperature source Oral, resp. rate 18, height 5' 4"  (1.626 m), weight 220 lb 12.8 oz (100.154 kg). General appearance: alert GI: soft, non-tender; bowel sounds normal; no masses,  no organomegaly Extremities: edema 1+  Disposition: 01-Home or Self Care  Discharge Orders   Future Appointments Provider Department Dept Phone   02/26/2014 3:30 PM Shelly Bombard, MD [place] [phone]   Future Orders Complete By Expires   Discharge activity: Bedrest  As directed    Discharge diet:  No restrictions  As directed    Fetal Kick Count:  Lie on our left side for one hour after a meal, and count the number of times your baby kicks.  If it is less than 5 times, get up, move around and drink some juice.  Repeat the test 30 minutes later.  If it is still less than 5 kicks in an hour, notify your doctor.  As directed    No sexual activity restrictions  As directed        Medication List         amLODipine 2.5 MG tablet  Commonly known as:  NORVASC  Take 1 tablet (2.5 mg total) by mouth daily.     amLODipine 5 MG tablet  Commonly known as:  NORVASC  Take 5 mg by mouth daily.     aspirin EC 81 MG tablet  Take 81 mg  by mouth daily.     labetalol 300 MG tablet  Commonly known as:  NORMODYNE  Take 2 tablets (600 mg total) by mouth 2 (two) times daily.     ondansetron 8 MG disintegrating tablet  Commonly known as:  ZOFRAN ODT  Take 1 tablet (8 mg total) by mouth every 8 (eight) hours as needed for nausea.     pantoprazole 40 MG tablet  Commonly known as:  PROTONIX  Take 1 tablet (40 mg total) by mouth daily.     prenatal multivitamin Tabs tablet  Take 1 tablet by mouth daily at 12 noon.           Follow-up Information   Follow up with Agnes Lawrence, MD. Schedule an appointment as soon as possible for a visit on 02/26/2014.   Specialty:  Obstetrics and Gynecology   Contact information:   [address] [place] [address] [phone]       Signed: Agnes Lawrence 02/21/2014, 1:22 PM

## 2014-02-21 NOTE — Progress Notes (Signed)
Dr. Delsa Sale phoned at pt request for plan of care and hospital stay timeline.  Reassured that Dr. Delsa Sale is consulting with MFM re: plan of care and that Dr. Delsa Sale will be in to see her today. Pt voices understanding.

## 2014-02-21 NOTE — Discharge Instructions (Signed)
Hypertension During Pregnancy Hypertension is also called high blood pressure. It can occur at any time in life and during pregnancy. When you have hypertension, there is extra pressure inside your blood vessels that carry blood from the heart to the rest of your body (arteries). Hypertension during pregnancy can cause problems for you and your baby. Your baby might not weigh as much as it should at birth or might be born early (premature). Very bad cases of hypertension during pregnancy can be life threatening.  Different types of hypertension can occur during pregnancy.   Chronic hypertension. This happens when a woman has hypertension before pregnancy and it continues during pregnancy.  Gestational hypertension. This is when hypertension develops during pregnancy.  Preeclampsia or toxemia of pregnancy. This is a very serious type of hypertension that develops only during pregnancy. It is a disease that affects the whole body (systemic) and can be very dangerous for both mother and baby.  Gestational hypertension and preeclampsia usually go away after your baby is born. Blood pressure generally stabilizes within 6 weeks. Women who have hypertension during pregnancy have a greater chance of developing hypertension later in life or with future pregnancies. RISK FACTORS Some factors make you more likely to develop hypertension during pregnancy. Risk factors include:  Having hypertension before pregnancy.  Having hypertension during a previous pregnancy.  Being overweight.  Being older than 57.  Being pregnant with more than one baby (multiples).  Having diabetes or kidney problems. SIGNS AND SYMPTOMS Chronic and gestational hypertension rarely cause symptoms. Preeclampsia has symptoms, which may include:  Increased protein in your urine. Your health care provider will check for this at every prenatal visit.  Swelling of your hands and face.  Rapid weight gain.  Headaches.  Visual  changes.  Being bothered by light.  Abdominal pain, especially in the right upper area.  Chest pain.  Shortness of breath.  Increased reflexes.  Seizures. Seizures occur with a more severe form of preeclampsia, called eclampsia. DIAGNOSIS   You may be diagnosed with hypertension during a regular prenatal exam. At each visit, tests may include:  Blood pressure checks.  A urine test to check for protein in your urine.  The type of hypertension you are diagnosed with depends on when you developed it. It also depends on your specific blood pressure reading.  Developing hypertension before 20 weeks of pregnancy is consistent with chronic hypertension.  Developing hypertension after 20 weeks of pregnancy is consistent with gestational hypertension.  Hypertension with increased urinary protein is diagnosed as preeclampsia.  Blood pressure measurements that stay above 696 systolic or 789 diastolic are a sign of severe preeclampsia. TREATMENT Treatment for hypertension during pregnancy varies. Treatment depends on the type of hypertension and how serious it is.  If you take medicine for chronic hypertension, you may need to switch medicines.  Drugs called ACE inhibitors should not be taken during pregnancy.  Low-dose aspirin may be suggested for women who have risk factors for preeclampsia.  If you have gestational hypertension, you may need to take a blood pressure medicine that is safe during pregnancy. Your health care provider will recommend the appropriate medicine.  If you have severe preeclampsia, you may need to be in the hospital. Health care providers will watch you and the baby very closely. You also may need to take medicine (magnesium sulfate) to prevent seizures and lower blood pressure.  Sometimes an early delivery is needed. This may be the case if the condition worsens. It would  be done to protect you and the baby. The only cure for preeclampsia is delivery. HOME  CARE INSTRUCTIONS  Schedule and keep all of your regular appointments for prenatal care.  Only take over-the-counter or prescription medicines as directed by your health care provider. Tell your health care provider about all medicines you take.  Eat as little salt as possible.  Get regular exercise.  Do not drink alcohol.  Do not use tobacco products.  Do not drink products with caffeine.  Lie on your left side when resting. SEEK IMMEDIATE MEDICAL CARE IF:  You have severe abdominal pain.  You have sudden swelling in the hands, ankles, or face.  You gain 4 pounds (1.8 kg) or more in 1 week.  You vomit repeatedly.  You have vaginal bleeding.  You do not feel the baby moving as much.  You have a headache.  You have blurred or double vision.  You have muscle twitching or spasms.  You have shortness of breath.  You have blue fingernails and lips.  You have blood in your urine. MAKE SURE YOU:  Understand these instructions.  Will watch your condition.  Will get help right away if you are not doing well or get worse. Document Released: 08/18/2011 Document Revised: 09/20/2013 Document Reviewed: 06/29/2013 [facility] Patient Information 2014 [place], [place].

## 2014-02-26 ENCOUNTER — Encounter: Payer: BC Managed Care – PPO | Admitting: Obstetrics

## 2014-02-26 ENCOUNTER — Inpatient Hospital Stay (HOSPITAL_COMMUNITY)
Admission: AD | Admit: 2014-02-26 | Discharge: 2014-02-26 | Disposition: A | Discharge status: Home / Self Care | Payer: BC Managed Care – PPO | Source: Ambulatory Visit | Attending: Obstetrics | Admitting: Obstetrics

## 2014-02-26 ENCOUNTER — Encounter (HOSPITAL_COMMUNITY): Payer: Self-pay | Admitting: *Deleted

## 2014-02-26 ENCOUNTER — Ambulatory Visit (INDEPENDENT_AMBULATORY_CARE_PROVIDER_SITE_OTHER): Payer: BC Managed Care – PPO | Admitting: Obstetrics

## 2014-02-26 VITALS — BP 167/96 | Wt 220.0 lb

## 2014-02-26 DIAGNOSIS — Z3480 Encounter for supervision of other normal pregnancy, unspecified trimester: Secondary | ICD-10-CM

## 2014-02-26 DIAGNOSIS — O139 Gestational [pregnancy-induced] hypertension without significant proteinuria, unspecified trimester: Secondary | ICD-10-CM

## 2014-02-26 DIAGNOSIS — IMO0002 Reserved for concepts with insufficient information to code with codable children: Secondary | ICD-10-CM

## 2014-02-26 DIAGNOSIS — G35 Multiple sclerosis: Secondary | ICD-10-CM | POA: Insufficient documentation

## 2014-02-26 DIAGNOSIS — O1400 Mild to moderate pre-eclampsia, unspecified trimester: Secondary | ICD-10-CM

## 2014-02-26 LAB — COMPREHENSIVE METABOLIC PANEL
ALBUMIN: 2.6 g/dL — AB (ref 3.5–5.2)
ALT: 19 U/L (ref 0–35)
AST: 17 U/L (ref 0–37)
Alkaline Phosphatase: 137 U/L — ABNORMAL HIGH (ref 39–117)
BUN: 15 mg/dL (ref 6–23)
CALCIUM: 9.6 mg/dL (ref 8.4–10.5)
CHLORIDE: 101 meq/L (ref 96–112)
CO2: 20 meq/L (ref 19–32)
CREATININE: 0.94 mg/dL (ref 0.50–1.10)
GFR calc Af Amer: 89 mL/min — ABNORMAL LOW (ref >90)
GFR, EST NON AFRICAN AMERICAN: 77 mL/min — AB (ref >90)
Glucose, Bld: 108 mg/dL — ABNORMAL HIGH (ref 70–99)
Potassium: 4.4 mEq/L (ref 3.7–5.3)
Sodium: 135 mEq/L — ABNORMAL LOW (ref 137–147)
Total Protein: 6.2 g/dL (ref 6.0–8.3)

## 2014-02-26 LAB — PROTEIN / CREATININE RATIO, URINE
Creatinine, Urine: 181.2 mg/dL
Protein Creatinine Ratio: 1.02 — ABNORMAL HIGH (ref 0.00–0.15)
Total Protein, Urine: 184.5 mg/dL

## 2014-02-26 LAB — URINALYSIS, ROUTINE W REFLEX MICROSCOPIC
Bilirubin Urine: NEGATIVE
Glucose, UA: NEGATIVE mg/dL
Hgb urine dipstick: NEGATIVE
KETONES UR: NEGATIVE mg/dL
LEUKOCYTES UA: NEGATIVE
Nitrite: NEGATIVE
PROTEIN: 100 mg/dL — AB
Specific Gravity, Urine: >1.03 — ABNORMAL HIGH (ref 1.005–1.030)
UROBILINOGEN UA: 0.2 mg/dL (ref 0.0–1.0)
pH: 6 (ref 5.0–8.0)

## 2014-02-26 LAB — LACTATE DEHYDROGENASE: LDH: 190 U/L (ref 94–250)

## 2014-02-26 LAB — POCT URINALYSIS DIPSTICK
Bilirubin, UA: NEGATIVE
Blood, UA: NEGATIVE
GLUCOSE UA: NEGATIVE
Ketones, UA: NEGATIVE
LEUKOCYTES UA: NEGATIVE
NITRITE UA: NEGATIVE
SPEC GRAV UA: 1.02
UROBILINOGEN UA: NEGATIVE
pH, UA: 6

## 2014-02-26 LAB — CBC
HEMATOCRIT: 36.4 % (ref 36.0–46.0)
Hemoglobin: 12.9 g/dL (ref 12.0–15.0)
MCH: 33.8 pg (ref 26.0–34.0)
MCHC: 35.4 g/dL (ref 30.0–36.0)
MCV: 95.3 fL (ref 78.0–100.0)
PLATELETS: 261 10*3/uL (ref 150–400)
RBC: 3.82 MIL/uL — ABNORMAL LOW (ref 3.87–5.11)
RDW: 13.2 % (ref 11.5–15.5)
WBC: 11 10*3/uL — ABNORMAL HIGH (ref 4.0–10.5)

## 2014-02-26 LAB — URINE MICROSCOPIC-ADD ON

## 2014-02-26 LAB — URIC ACID: Uric Acid, Serum: 6.6 mg/dL (ref 2.4–7.0)

## 2014-02-26 MED ORDER — AMLODIPINE BESYLATE 10 MG PO TABS: 10.0000 mg | ORAL_TABLET | Freq: Every day | ORAL | Status: AC

## 2014-02-26 NOTE — MAU Note (Signed)
States sent from MD office due to protein in urine. States was hospitalized last week for PIH.

## 2014-02-26 NOTE — Progress Notes (Signed)
Pulse 79 Pt state that she has had more irregular contractions.

## 2014-02-26 NOTE — Discharge Instructions (Signed)
Preeclampsia and Eclampsia Preeclampsia is a condition of high blood pressure during pregnancy. It can happen at 20 weeks or later in pregnancy. If high blood pressure occurs in the second half of pregnancy with no other symptoms, it is called gestational hypertension and goes away after the baby is born. If any of the symptoms listed below develop with gestational hypertension, it is then called preeclampsia. Eclampsia (convulsions) may follow preeclampsia. This is one of the reasons for regular prenatal checkups. Early diagnosis and treatment are very important to prevent eclampsia. CAUSES  There is no known cause of preeclampsia/eclampsia in pregnancy. There are several known conditions that may put the pregnant woman at risk, such as:  The first pregnancy.  Having preeclampsia in a past pregnancy.  Having lasting (chronic) high blood pressure.  Having multiples (twins, triplets).  Being age 37 or older.  African American ethnic background.  Having kidney disease or diabetes.  Medical conditions such as lupus or blood diseases.  Being overweight (obese). SYMPTOMS   High blood pressure.  Headaches.  Sudden weight gain.  Swelling of hands, face, legs, and feet.  Protein in the urine.  Feeling sick to your stomach (nauseous) and throwing up (vomiting).  Vision problems (blurred or double vision).  Numbness in the face, arms, legs, and feet.  Dizziness.  Slurred speech.  Preeclampsia can cause growth retardation in the fetus.  Separation (abruption) of the placenta.  Not enough fluid in the amniotic sac (oligohydramnios).  Sensitivity to bright lights.  Belly (abdominal) pain. DIAGNOSIS  If protein is found in the urine in the second half of pregnancy, this is considered preeclampsia. Other symptoms mentioned above may also be present. TREATMENT  It is necessary to treat this.  Your caregiver may prescribe bed rest early in this condition. Plenty of rest and  salt restriction may be all that is needed.  Medicines may be necessary to lower blood pressure if the condition does not respond to more conservative measures.  In more severe cases, hospitalization may be needed:  For treatment of blood pressure.  To control fluid retention.  To monitor the baby to see if the condition is causing harm to the baby.  Hospitalization is the best way to treat the first sign of preeclampsia. This is so the mother and baby can be watched closely and blood tests can be done effectively and correctly.  If the condition becomes severe, it may be necessary to induce labor or to remove the infant by surgical means (cesarean section). The best cure for preeclampsia/eclampsia is to deliver the baby. Preeclampsia and eclampsia involve risks to mother and infant. Your caregiver will discuss these risks with you. Together, you can work out the best possible approach to your problems. Make sure you keep your prenatal visits as scheduled. Not keeping appointments could result in a chronic or permanent injury, pain, disability to you, and death or injury to you or your unborn baby. If there is any problem keeping the appointment, you must call to reschedule. HOME CARE INSTRUCTIONS   Keep your prenatal appointments and tests as scheduled.  Tell your caregiver if you have any of the above risk factors.  Get plenty of rest and sleep.  Eat a balanced diet that is low in salt, and do not add salt to your food.  Avoid stressful situations.  Only take over-the-counter and prescriptions medicines for pain, discomfort, or fever as directed by your caregiver. SEEK IMMEDIATE MEDICAL CARE IF:   You develop severe swelling  anywhere in the body. This usually occurs in the legs.  You gain 05 lb/2.3 kg or more in a week.  You develop a severe headache, dizziness, problems with your vision, or confusion.  You have abdominal pain, nausea, or vomiting.  You have a seizure.  You  have trouble moving any part of your body, or you develop numbness or problems speaking.  You have bruising or abnormal bleeding from anywhere in the body.  You develop a stiff neck.  You pass out. MAKE SURE YOU:   Understand these instructions.  Will watch your condition.  Will get help right away if you are not doing well or get worse. Document Released: 11/27/2000 Document Revised: 02/22/2012 Document Reviewed: 07/13/2008 [facility] Patient Information 2014 [place].

## 2014-02-26 NOTE — MAU Provider Note (Signed)
History     CSN: [id number]  Arrival date and time: 02/26/14 1331   First Provider Initiated Contact with Patient 02/26/14 1359      No chief complaint on file.  HPI Comments: Angel Phelps 38 y.o. [id number]  Presents to MAU for evaluation of preeclampsia. She was seen 2 days ago in MAU with same complaint. She was unable to tolerate Procardia and had her Norvasc increased to 7.5 mg. She denies any headache, visual disturbance or edema. Her BP has been 146-167/ 89-96 in MAU.       Past Medical History  Diagnosis Date  . HTN in pregnancy, chronic     Took Labetol, G3P1001 term female, no comps, miscarried, 1st trimester, failed IUD [facility]) 11/04  . Hypertension   . Obesity   . Multiple sclerosis   . Preterm labor   . Multiple sclerosis     Past Surgical History  Procedure Laterality Date  . Gravida      4/para2/miscarriged2; preeclamptic #2 pregnancy  . Spinal tap  11/27/2008  . Mri  11/18/2008    Head, abn  . Cesarean section    . Cholecystectomy    . Salpingectomy      R tube  . Dilation and curettage of uterus      Family History  Problem Relation Age of Onset  . Hypertension Mother   . Heart attack Mother 72    Massive MI  . Hypertension Father   . Hyperlipidemia Father   . Diabetes Father   . Seizures Brother   . Heart attack Maternal Grandmother     MI  . Hypertension Maternal Grandmother   . Anesthesia problems Neg Hx     History  Substance Use Topics  . Smoking status: Never Smoker   . Smokeless tobacco: Never Used  . Alcohol Use: No    Allergies: No Known Allergies  Prescriptions prior to admission  Medication Sig Dispense Refill  . acetaminophen (TYLENOL) 500 MG tablet Take 500-1,000 mg by mouth every 6 (six) hours as needed for headache.      Marland Kitchen amLODipine (NORVASC) 5 MG tablet Take 7.5 mg by mouth daily.       Marland Kitchen aspirin EC 81 MG tablet Take 81 mg by mouth daily.      Marland Kitchen labetalol (NORMODYNE) 300 MG tablet Take 2 tablets (600 mg total) by  mouth 2 (two) times daily.  60 tablet  3  . ondansetron (ZOFRAN ODT) 8 MG disintegrating tablet Take 1 tablet (8 mg total) by mouth every 8 (eight) hours as needed for nausea.  30 tablet  4  . pantoprazole (PROTONIX) 40 MG tablet Take 1 tablet (40 mg total) by mouth daily.  30 tablet  1  . Prenatal Vit-Fe Fumarate-FA (PRENATAL MULTIVITAMIN) TABS tablet Take 1 tablet by mouth daily at 12 noon.  120 tablet  3    Review of Systems  Constitutional: Negative.   Eyes: Negative for blurred vision.  Respiratory: Negative.   Cardiovascular: Negative.   Gastrointestinal: Negative.   Genitourinary: Negative.   Musculoskeletal: Negative.   Skin: Negative.   Neurological: Negative.  Negative for headaches.  Psychiatric/Behavioral: The patient is not nervous/anxious.    Physical Exam   Blood pressure 144/85, pulse 80, temperature 98.5 F (36.9 C), temperature source Oral, resp. rate 18, height 5' 4"  (1.626 m), weight 99.791 kg (220 lb).  Physical Exam  Constitutional: She is oriented to person, place, and time. She appears well-developed and well-nourished. No distress.  HENT:  Head: Normocephalic and atraumatic.  Cardiovascular: Normal rate, regular rhythm and normal heart sounds.   Respiratory: Effort normal and breath sounds normal.  GI: Soft. Bowel sounds are normal.  Genitourinary:  Not examined  Musculoskeletal: Normal range of motion.  Neurological: She is alert and oriented to person, place, and time.  Skin: Skin is warm and dry.  Psychiatric: She has a normal mood and affect. Her behavior is normal. Judgment and thought content normal.    Strip: Good variability, no accelerations. 10 x10 but no 15x15   Results for orders placed during the hospital encounter of 02/26/14 (from the past 24 hour(s))  URINALYSIS, ROUTINE W REFLEX MICROSCOPIC     Status: Abnormal   Collection Time    02/26/14  1:37 PM      Result Value Ref Range   Color, Urine YELLOW  YELLOW   APPearance CLEAR   CLEAR   Specific Gravity, Urine >1.030 (*) 1.005 - 1.030   pH 6.0  5.0 - 8.0   Glucose, UA NEGATIVE  NEGATIVE mg/dL   Hgb urine dipstick NEGATIVE  NEGATIVE   Bilirubin Urine NEGATIVE  NEGATIVE   Ketones, ur NEGATIVE  NEGATIVE mg/dL   Protein, ur 100 (*) NEGATIVE mg/dL   Urobilinogen, UA 0.2  0.0 - 1.0 mg/dL   Nitrite NEGATIVE  NEGATIVE   Leukocytes, UA NEGATIVE  NEGATIVE  PROTEIN / CREATININE RATIO, URINE     Status: Abnormal   Collection Time    02/26/14  1:37 PM      Result Value Ref Range   Creatinine, Urine 181.20     Total Protein, Urine 184.5     PROTEIN CREATININE RATIO 1.02 (*) 0.00 - 0.15  URINE MICROSCOPIC-ADD ON     Status: Abnormal   Collection Time    02/26/14  1:37 PM      Result Value Ref Range   Squamous Epithelial / LPF FEW (*) RARE   RBC / HPF 0-2  <3 RBC/hpf   Bacteria, UA RARE  RARE  CBC     Status: Abnormal   Collection Time    02/26/14  2:30 PM      Result Value Ref Range   WBC 11.0 (*) 4.0 - 10.5 K/uL   RBC 3.82 (*) 3.87 - 5.11 MIL/uL   Hemoglobin 12.9  12.0 - 15.0 g/dL   HCT 36.4  36.0 - 46.0 %   MCV 95.3  78.0 - 100.0 fL   MCH 33.8  26.0 - 34.0 pg   MCHC 35.4  30.0 - 36.0 g/dL   RDW 13.2  11.5 - 15.5 %   Platelets 261  150 - 400 K/uL  COMPREHENSIVE METABOLIC PANEL     Status: Abnormal   Collection Time    02/26/14  2:30 PM      Result Value Ref Range   Sodium 135 (*) 137 - 147 mEq/L   Potassium 4.4  3.7 - 5.3 mEq/L   Chloride 101  96 - 112 mEq/L   CO2 20  19 - 32 mEq/L   Glucose, Bld 108 (*) 70 - 99 mg/dL   BUN 15  6 - 23 mg/dL   Creatinine, Ser 0.94  0.50 - 1.10 mg/dL   Calcium 9.6  8.4 - 10.5 mg/dL   Total Protein 6.2  6.0 - 8.3 g/dL   Albumin 2.6 (*) 3.5 - 5.2 g/dL   AST 17  0 - 37 U/L   ALT 19  0 - 35 U/L   Alkaline Phosphatase  137 (*) 39 - 117 U/L   Total Bilirubin <0.2 (*) 0.3 - 1.2 mg/dL   GFR calc non Af Amer 77 (*) >90 mL/min   GFR calc Af Amer 89 (*) >90 mL/min  LACTATE DEHYDROGENASE     Status: None   Collection Time     02/26/14  2:30 PM      Result Value Ref Range   LDH 190  94 - 250 U/L  URIC ACID     Status: None   Collection Time    02/26/14  2:30 PM      Result Value Ref Range   Uric Acid, Serum 6.6  2.4 - 7.0 mg/dL     MAU Course  Procedures  MDM  CBC, CMET, protein/ creat ratio, LDH, Uric acid Spoke with Dr Jodi Mourning who advised patient to be sent home with increase in Norvasc to 10 mg and follow up with Dr Delsa Sale  Assessment and Plan   A: Mild Preeclampsia  P: Increase Norvasc 10 mg po qday Follow up with Dr Delsa Sale if headache developes, visual changes or abdominal pain occur Rest, fluids   Georgia Duff 02/26/2014, 2:18 PM

## 2014-02-27 ENCOUNTER — Encounter: Payer: Self-pay | Admitting: Obstetrics

## 2014-02-28 ENCOUNTER — Encounter: Payer: BC Managed Care – PPO | Admitting: Obstetrics

## 2014-03-01 ENCOUNTER — Encounter: Payer: BC Managed Care – PPO | Admitting: Advanced Practice Midwife

## 2014-03-05 ENCOUNTER — Ambulatory Visit (INDEPENDENT_AMBULATORY_CARE_PROVIDER_SITE_OTHER): Payer: BC Managed Care – PPO | Admitting: Obstetrics & Gynecology

## 2014-03-05 ENCOUNTER — Other Ambulatory Visit: Payer: Self-pay | Admitting: *Deleted

## 2014-03-05 VITALS — BP 163/97 | Temp 97.1°F | Wt 224.0 lb

## 2014-03-05 DIAGNOSIS — O321XX0 Maternal care for breech presentation, not applicable or unspecified: Secondary | ICD-10-CM

## 2014-03-05 DIAGNOSIS — O139 Gestational [pregnancy-induced] hypertension without significant proteinuria, unspecified trimester: Secondary | ICD-10-CM

## 2014-03-05 DIAGNOSIS — O10919 Unspecified pre-existing hypertension complicating pregnancy, unspecified trimester: Secondary | ICD-10-CM

## 2014-03-05 LAB — CBC
HEMATOCRIT: 37.9 % (ref 36.0–46.0)
HEMOGLOBIN: 13.6 g/dL (ref 12.0–15.0)
MCH: 33.1 pg (ref 26.0–34.0)
MCHC: 35.9 g/dL (ref 30.0–36.0)
MCV: 92.2 fL (ref 78.0–100.0)
Platelets: 289 10*3/uL (ref 150–400)
RBC: 4.11 MIL/uL (ref 3.87–5.11)
RDW: 13.5 % (ref 11.5–15.5)
WBC: 11 10*3/uL — AB (ref 4.0–10.5)

## 2014-03-05 LAB — POCT URINALYSIS DIPSTICK
BILIRUBIN UA: NEGATIVE
Glucose, UA: NEGATIVE
KETONES UA: NEGATIVE
Leukocytes, UA: NEGATIVE
Nitrite, UA: NEGATIVE
RBC UA: NEGATIVE
Spec Grav, UA: 1.01
Urobilinogen, UA: NEGATIVE
pH, UA: 6.5

## 2014-03-05 LAB — CREATININE, SERUM: Creat: 0.8 mg/dL (ref 0.50–1.10)

## 2014-03-05 LAB — AST: AST: 20 U/L (ref 0–37)

## 2014-03-05 LAB — LACTATE DEHYDROGENASE: LDH: 190 U/L (ref 94–250)

## 2014-03-05 LAB — ALT: ALT: 21 U/L (ref 0–35)

## 2014-03-05 NOTE — Progress Notes (Signed)
Pulse- 77 Pt states she is seeing little black spots about once a week.

## 2014-03-06 ENCOUNTER — Other Ambulatory Visit: Payer: Self-pay | Admitting: Obstetrics & Gynecology

## 2014-03-06 ENCOUNTER — Other Ambulatory Visit: Payer: Self-pay | Admitting: *Deleted

## 2014-03-06 ENCOUNTER — Ambulatory Visit (INDEPENDENT_AMBULATORY_CARE_PROVIDER_SITE_OTHER): Payer: BC Managed Care – PPO

## 2014-03-06 DIAGNOSIS — I10 Essential (primary) hypertension: Secondary | ICD-10-CM

## 2014-03-06 DIAGNOSIS — O09529 Supervision of elderly multigravida, unspecified trimester: Secondary | ICD-10-CM

## 2014-03-06 DIAGNOSIS — O10019 Pre-existing essential hypertension complicating pregnancy, unspecified trimester: Secondary | ICD-10-CM

## 2014-03-06 DIAGNOSIS — O365990 Maternal care for other known or suspected poor fetal growth, unspecified trimester, not applicable or unspecified: Secondary | ICD-10-CM

## 2014-03-06 DIAGNOSIS — O09899 Supervision of other high risk pregnancies, unspecified trimester: Secondary | ICD-10-CM

## 2014-03-06 DIAGNOSIS — O321XX0 Maternal care for breech presentation, not applicable or unspecified: Secondary | ICD-10-CM | POA: Insufficient documentation

## 2014-03-06 DIAGNOSIS — O10919 Unspecified pre-existing hypertension complicating pregnancy, unspecified trimester: Secondary | ICD-10-CM

## 2014-03-06 LAB — PROTEIN / CREATININE RATIO, URINE
CREATININE, URINE: 74.9 mg/dL
PROTEIN CREATININE RATIO: 1.5 — AB (ref <0.15)
TOTAL PROTEIN, URINE: 112 mg/dL

## 2014-03-06 LAB — PATHOLOGIST SMEAR REVIEW

## 2014-03-06 MED ORDER — DEXTROSE 5 % IV SOLN
500.0000 mg | Freq: Once | INTRAVENOUS | Status: AC
  Administered 2014-03-07: 500 mg via INTRAVENOUS
  Filled 2014-03-06: qty 500

## 2014-03-06 MED ORDER — LABETALOL HCL 300 MG PO TABS: 300.0000 mg | ORAL_TABLET | Freq: Two times a day (BID) | ORAL | Status: DC

## 2014-03-06 MED ORDER — LABETALOL HCL 100 MG PO TABS: 100.0000 mg | ORAL_TABLET | Freq: Two times a day (BID) | ORAL | Status: DC

## 2014-03-06 NOTE — Patient Instructions (Signed)
Cesarean Delivery  Cesarean delivery is the birth of a baby through a cut (incision) in the abdomen and womb (uterus).  LET [facility] CARE PROVIDER KNOW ABOUT:  All medicines you are taking, including vitamins, herbs, eye drops, creams, and over-the-counter medicines.  Previous problems you or members of your family have had with the use of anesthetics.  Any blood disorders you have.  Previous surgeries you have had.  Medical conditions you have.  Any allergies you have.  Complicationsinvolving the pregnancy. RISKS AND COMPLICATIONS  Generally, this is a safe procedure. However, as with any procedure, complications can occur. Possible complications include:  Bleeding.  Infection.  Blood clots.  Injury to surrounding organs.  Problems with anesthesia.  Injury to the baby. BEFORE THE PROCEDURE   You may be given an antacid medicine to drink. This will prevent acid contents in your stomach from going into your lungs if you vomit during the surgery.  You may be given an antibiotic medicine to prevent infection. PROCEDURE   Hair may be removed from your pubic area and your lower abdomen. This is to prevent infection in the incision site.  A tube (Foley catheter) will be placed in your bladder to drain your urine from your bladder into a bag. This keeps your bladder empty during surgery.  An IV tube will be placed in your vein.  You may be given medicine to numb the lower half of your body (regional anesthetic). If you were in labor, you may have already had an epidural in place which can be used in both labor and cesarean delivery. You may possibly be given medicine to make you sleep (general anesthetic) though this is not as common.  An incision will be made in your abdomen that extends to your uterus. There are 2 basic kinds of incisions:  The horizontal (transverse) incision. Horizontal incisions are from side to side and are used for most routine cesarean  deliveries.  The vertical incision. The vertical incision is from the top of the abdomen to the bottom and is less commonly used. It is often done for women who have a serious complication (extreme prematurity) or under emergency situations.  The horizontal and vertical incisions may both be used at the same time. However, this is very uncommon.  An incision is then made in your uterus to deliver the baby.  Your baby will then be delivered.  Both incisions are then closed with absorbable stitches. AFTER THE PROCEDURE   If you were awake during the surgery, you will see your baby right away. If you were asleep, you will see your baby as soon as you are awake.  You may breastfeed your baby after surgery.  You may be able to get up and walk the same day as the surgery. If you need to stay in bed for a period of time, you will receive help to turn, cough, and take deep breaths after surgery. This helps prevent lung problems such as pneumonia.  Do not get out of bed alone the first time after surgery. You will need help getting out of bed until you are able to do this by yourself.  You may be able to shower the day after your cesarean delivery. After the bandage (dressing) is taken off the incision site, a nurse will assist you to shower if you would like help.  You will have pneumatic compression hose placed on your lower legs. This is done to prevent blood clots. When you are up  and walking regularly, they will no longer be necessary.  Do not cross your legs when you sit.  Save any blood clots that you pass. If you pass a clot while on the toilet, do not flush it. Call for the nurse. Tell the nurse if you think you are bleeding too much or passing too many clots.  You will be given medicine as needed. Let your health care providers know if you are hurting. You may also be given an antibiotic to prevent an infection.  Your IV tube will be taken out when you are drinking a reasonable  amount of fluids. The Foley catheter is taken out when you are up and walking.  If your blood type is Rh negative and your baby's blood type is Rh positive, you will be given a shot of anti-D immune globulin. This shot prevents you from having Rh problems with a future pregnancy. You should get the shot even if you had your tubes tied (tubal ligation).  If you are allowed to take the baby for a walk, place the baby in the bassinet and push it. Do not carry your baby in your arms. Document Released: 11/30/2005 Document Revised: 09/20/2013 Document Reviewed: 06/21/2013 [facility] Patient Information 2014 [place].

## 2014-03-07 ENCOUNTER — Encounter: Payer: Self-pay | Admitting: Obstetrics & Gynecology

## 2014-03-07 ENCOUNTER — Encounter (HOSPITAL_COMMUNITY)
Admission: RE | Disposition: A | Discharge status: Home / Self Care | Payer: Self-pay | Source: Ambulatory Visit | Attending: Obstetrics & Gynecology

## 2014-03-07 ENCOUNTER — Other Ambulatory Visit (HOSPITAL_COMMUNITY): Payer: BC Managed Care – PPO

## 2014-03-07 ENCOUNTER — Encounter (HOSPITAL_COMMUNITY): Payer: Self-pay | Admitting: Anesthesiology

## 2014-03-07 ENCOUNTER — Inpatient Hospital Stay (HOSPITAL_COMMUNITY)
Admission: RE | Admit: 2014-03-07 | Discharge: 2014-03-11 | DRG: 765 | Disposition: A | Discharge status: Home / Self Care | Payer: BC Managed Care – PPO | Source: Ambulatory Visit | Attending: Obstetrics & Gynecology | Admitting: Obstetrics & Gynecology

## 2014-03-07 ENCOUNTER — Encounter (HOSPITAL_COMMUNITY): Payer: BC Managed Care – PPO | Admitting: Anesthesiology

## 2014-03-07 ENCOUNTER — Inpatient Hospital Stay (HOSPITAL_COMMUNITY): Payer: BC Managed Care – PPO | Admitting: Anesthesiology

## 2014-03-07 ENCOUNTER — Encounter (HOSPITAL_COMMUNITY): Payer: Self-pay | Admitting: Pharmacist

## 2014-03-07 DIAGNOSIS — Z302 Encounter for sterilization: Secondary | ICD-10-CM

## 2014-03-07 DIAGNOSIS — E669 Obesity, unspecified: Secondary | ICD-10-CM | POA: Diagnosis present

## 2014-03-07 DIAGNOSIS — I10 Essential (primary) hypertension: Secondary | ICD-10-CM | POA: Diagnosis present

## 2014-03-07 DIAGNOSIS — O09899 Supervision of other high risk pregnancies, unspecified trimester: Secondary | ICD-10-CM

## 2014-03-07 DIAGNOSIS — O99214 Obesity complicating childbirth: Secondary | ICD-10-CM

## 2014-03-07 DIAGNOSIS — O1400 Mild to moderate pre-eclampsia, unspecified trimester: Secondary | ICD-10-CM

## 2014-03-07 DIAGNOSIS — Z641 Problems related to multiparity: Secondary | ICD-10-CM

## 2014-03-07 DIAGNOSIS — O09299 Supervision of pregnancy with other poor reproductive or obstetric history, unspecified trimester: Secondary | ICD-10-CM

## 2014-03-07 DIAGNOSIS — K219 Gastro-esophageal reflux disease without esophagitis: Secondary | ICD-10-CM | POA: Diagnosis present

## 2014-03-07 DIAGNOSIS — IMO0002 Reserved for concepts with insufficient information to code with codable children: Secondary | ICD-10-CM | POA: Diagnosis present

## 2014-03-07 DIAGNOSIS — O321XX0 Maternal care for breech presentation, not applicable or unspecified: Secondary | ICD-10-CM

## 2014-03-07 DIAGNOSIS — Z98891 History of uterine scar from previous surgery: Secondary | ICD-10-CM

## 2014-03-07 LAB — COMPREHENSIVE METABOLIC PANEL
ALT: 22 U/L (ref 0–35)
AST: 21 U/L (ref 0–37)
Albumin: 3 g/dL — ABNORMAL LOW (ref 3.5–5.2)
Alkaline Phosphatase: 155 U/L — ABNORMAL HIGH (ref 39–117)
BUN: 12 mg/dL (ref 6–23)
CALCIUM: 9.7 mg/dL (ref 8.4–10.5)
CO2: 20 meq/L (ref 19–32)
CREATININE: 0.8 mg/dL (ref 0.50–1.10)
Chloride: 100 mEq/L (ref 96–112)
GFR calc non Af Amer: >90 mL/min (ref >90)
GLUCOSE: 80 mg/dL (ref 70–99)
Potassium: 4.4 mEq/L (ref 3.7–5.3)
Sodium: 134 mEq/L — ABNORMAL LOW (ref 137–147)
TOTAL PROTEIN: 6.7 g/dL (ref 6.0–8.3)
Total Bilirubin: 0.4 mg/dL (ref 0.3–1.2)

## 2014-03-07 LAB — CBC
HCT: 37.8 % (ref 36.0–46.0)
HEMOGLOBIN: 13.3 g/dL (ref 12.0–15.0)
MCH: 33.3 pg (ref 26.0–34.0)
MCHC: 35.2 g/dL (ref 30.0–36.0)
MCV: 94.7 fL (ref 78.0–100.0)
Platelets: 266 10*3/uL (ref 150–400)
RBC: 3.99 MIL/uL (ref 3.87–5.11)
RDW: 13 % (ref 11.5–15.5)
WBC: 11 10*3/uL — ABNORMAL HIGH (ref 4.0–10.5)

## 2014-03-07 LAB — US OB DETAIL + 14 WK

## 2014-03-07 LAB — TYPE AND SCREEN
ABO/RH(D): B POS
Antibody Screen: NEGATIVE

## 2014-03-07 LAB — RPR: RPR Ser Ql: NONREACTIVE

## 2014-03-07 SURGERY — Surgical Case
Anesthesia: Epidural | Site: Abdomen

## 2014-03-07 MED ORDER — MEPERIDINE HCL 25 MG/ML IJ SOLN: 6.2500 mg | INTRAMUSCULAR | Status: DC | PRN

## 2014-03-07 MED ORDER — MORPHINE SULFATE (PF) 0.5 MG/ML IJ SOLN
INTRAMUSCULAR | Status: DC | PRN
  Administered 2014-03-07: 1 mg via INTRAVENOUS
  Administered 2014-03-07: 4 mg via EPIDURAL

## 2014-03-07 MED ORDER — SIMETHICONE 80 MG PO CHEW
80.0000 mg | CHEWABLE_TABLET | ORAL | Status: DC
  Administered 2014-03-08 – 2014-03-11 (×4): 80 mg via ORAL
  Filled 2014-03-07 (×4): qty 1

## 2014-03-07 MED ORDER — OXYCODONE-ACETAMINOPHEN 5-325 MG PO TABS
1.0000 | ORAL_TABLET | ORAL | Status: DC | PRN
  Administered 2014-03-09 – 2014-03-11 (×7): 1 via ORAL
  Filled 2014-03-07 (×7): qty 1

## 2014-03-07 MED ORDER — MAGNESIUM SULFATE 40 G IN LACTATED RINGERS - SIMPLE
2.0000 g/h | INTRAVENOUS | Status: DC
  Administered 2014-03-07: 2 g/h via INTRAVENOUS
  Filled 2014-03-07: qty 500

## 2014-03-07 MED ORDER — DIBUCAINE 1 % RE OINT: 1.0000 "application " | TOPICAL_OINTMENT | RECTAL | Status: DC | PRN

## 2014-03-07 MED ORDER — ONDANSETRON HCL 4 MG/2ML IJ SOLN: 4.0000 mg | Freq: Three times a day (TID) | INTRAMUSCULAR | Status: DC | PRN

## 2014-03-07 MED ORDER — ONDANSETRON HCL 4 MG PO TABS: 4.0000 mg | ORAL_TABLET | ORAL | Status: DC | PRN

## 2014-03-07 MED ORDER — LACTATED RINGERS IV SOLN
INTRAVENOUS | Status: DC
  Administered 2014-03-08: 13:00:00 via INTRAVENOUS

## 2014-03-07 MED ORDER — SODIUM BICARBONATE 8.4 % IV SOLN
INTRAVENOUS | Status: AC
  Filled 2014-03-07: qty 50

## 2014-03-07 MED ORDER — NALBUPHINE HCL 10 MG/ML IJ SOLN: 5.0000 mg | INTRAMUSCULAR | Status: DC | PRN

## 2014-03-07 MED ORDER — AMLODIPINE BESYLATE 5 MG PO TABS
5.0000 mg | ORAL_TABLET | Freq: Every day | ORAL | Status: DC
  Filled 2014-03-07: qty 1

## 2014-03-07 MED ORDER — TETANUS-DIPHTH-ACELL PERTUSSIS 5-2.5-18.5 LF-MCG/0.5 IM SUSP
0.5000 mL | Freq: Once | INTRAMUSCULAR | Status: AC
  Administered 2014-03-09: 0.5 mL via INTRAMUSCULAR
  Filled 2014-03-07 (×2): qty 0.5

## 2014-03-07 MED ORDER — MORPHINE SULFATE 0.5 MG/ML IJ SOLN
INTRAMUSCULAR | Status: AC
  Filled 2014-03-07: qty 10

## 2014-03-07 MED ORDER — OXYTOCIN 10 UNIT/ML IJ SOLN
INTRAMUSCULAR | Status: AC
  Filled 2014-03-07: qty 4

## 2014-03-07 MED ORDER — DIPHENHYDRAMINE HCL 25 MG PO CAPS: 25.0000 mg | ORAL_CAPSULE | Freq: Four times a day (QID) | ORAL | Status: DC | PRN

## 2014-03-07 MED ORDER — PHENYLEPHRINE 40 MCG/ML (10ML) SYRINGE FOR IV PUSH (FOR BLOOD PRESSURE SUPPORT)
PREFILLED_SYRINGE | INTRAVENOUS | Status: AC
  Filled 2014-03-07: qty 5

## 2014-03-07 MED ORDER — FENTANYL CITRATE 0.05 MG/ML IJ SOLN
25.0000 ug | INTRAMUSCULAR | Status: DC | PRN
  Administered 2014-03-07 (×4): 25 ug via INTRAVENOUS

## 2014-03-07 MED ORDER — FENTANYL CITRATE 0.05 MG/ML IJ SOLN
INTRAMUSCULAR | Status: DC | PRN
  Administered 2014-03-07: 100 ug via EPIDURAL

## 2014-03-07 MED ORDER — LIDOCAINE-EPINEPHRINE (PF) 2 %-1:200000 IJ SOLN
INTRAMUSCULAR | Status: DC | PRN
  Administered 2014-03-07 (×3): 5 mL via EPIDURAL
  Administered 2014-03-07: 10 mL via EPIDURAL

## 2014-03-07 MED ORDER — PRENATAL MULTIVITAMIN CH
1.0000 | ORAL_TABLET | Freq: Every day | ORAL | Status: DC
  Administered 2014-03-08 – 2014-03-10 (×3): 1 via ORAL
  Filled 2014-03-07 (×3): qty 1

## 2014-03-07 MED ORDER — CEFAZOLIN SODIUM-DEXTROSE 2-3 GM-% IV SOLR
2.0000 g | INTRAVENOUS | Status: AC
  Administered 2014-03-07: 2 g via INTRAVENOUS

## 2014-03-07 MED ORDER — MAGNESIUM SULFATE BOLUS VIA INFUSION
4.0000 g | Freq: Once | INTRAVENOUS | Status: AC
  Administered 2014-03-07: 4 g via INTRAVENOUS
  Filled 2014-03-07: qty 500

## 2014-03-07 MED ORDER — ONDANSETRON HCL 4 MG/2ML IJ SOLN
INTRAMUSCULAR | Status: AC
  Filled 2014-03-07: qty 2

## 2014-03-07 MED ORDER — IBUPROFEN 600 MG PO TABS
600.0000 mg | ORAL_TABLET | Freq: Four times a day (QID) | ORAL | Status: DC
  Administered 2014-03-08 – 2014-03-11 (×14): 600 mg via ORAL
  Filled 2014-03-07 (×14): qty 1

## 2014-03-07 MED ORDER — SODIUM CHLORIDE 0.9 % IJ SOLN: 3.0000 mL | INTRAMUSCULAR | Status: DC | PRN

## 2014-03-07 MED ORDER — CARBOPROST TROMETHAMINE 250 MCG/ML IM SOLN
250.0000 ug | Freq: Once | INTRAMUSCULAR | Status: AC
  Administered 2014-03-07: 250 ug via INTRAMUSCULAR

## 2014-03-07 MED ORDER — DIPHENHYDRAMINE HCL 50 MG/ML IJ SOLN: 12.5000 mg | INTRAMUSCULAR | Status: DC | PRN

## 2014-03-07 MED ORDER — AMLODIPINE BESYLATE 5 MG PO TABS
5.0000 mg | ORAL_TABLET | Freq: Every day | ORAL | Status: DC
  Administered 2014-03-08 – 2014-03-11 (×4): 5 mg via ORAL
  Filled 2014-03-07 (×5): qty 1

## 2014-03-07 MED ORDER — SIMETHICONE 80 MG PO CHEW: 80.0000 mg | CHEWABLE_TABLET | ORAL | Status: DC | PRN

## 2014-03-07 MED ORDER — ONDANSETRON HCL 4 MG/2ML IJ SOLN
INTRAMUSCULAR | Status: DC | PRN
  Administered 2014-03-07: 4 mg via INTRAVENOUS

## 2014-03-07 MED ORDER — LABETALOL HCL 300 MG PO TABS
300.0000 mg | ORAL_TABLET | Freq: Two times a day (BID) | ORAL | Status: DC
  Administered 2014-03-07 – 2014-03-11 (×8): 300 mg via ORAL
  Filled 2014-03-07 (×8): qty 1

## 2014-03-07 MED ORDER — LACTATED RINGERS IV SOLN
Freq: Once | INTRAVENOUS | Status: AC
  Administered 2014-03-07: 14:00:00 via INTRAVENOUS

## 2014-03-07 MED ORDER — CEFAZOLIN SODIUM-DEXTROSE 2-3 GM-% IV SOLR
INTRAVENOUS | Status: AC
  Filled 2014-03-07: qty 50

## 2014-03-07 MED ORDER — METOCLOPRAMIDE HCL 5 MG/ML IJ SOLN: 10.0000 mg | Freq: Three times a day (TID) | INTRAMUSCULAR | Status: DC | PRN

## 2014-03-07 MED ORDER — ONDANSETRON HCL 4 MG/2ML IJ SOLN: 4.0000 mg | INTRAMUSCULAR | Status: DC | PRN

## 2014-03-07 MED ORDER — MAGNESIUM HYDROXIDE 400 MG/5ML PO SUSP
30.0000 mL | Freq: Every day | ORAL | Status: AC
  Administered 2014-03-08: 30 mL via ORAL
  Filled 2014-03-07 (×2): qty 30

## 2014-03-07 MED ORDER — SCOPOLAMINE 1 MG/3DAYS TD PT72
1.0000 | MEDICATED_PATCH | TRANSDERMAL | Status: DC
  Administered 2014-03-07: 1.5 mg via TRANSDERMAL

## 2014-03-07 MED ORDER — LANOLIN HYDROUS EX OINT: 1.0000 "application " | TOPICAL_OINTMENT | CUTANEOUS | Status: DC | PRN

## 2014-03-07 MED ORDER — NALOXONE HCL 0.4 MG/ML IJ SOLN: 0.4000 mg | INTRAMUSCULAR | Status: DC | PRN

## 2014-03-07 MED ORDER — LIDOCAINE-EPINEPHRINE (PF) 2 %-1:200000 IJ SOLN
INTRAMUSCULAR | Status: AC
  Filled 2014-03-07: qty 20

## 2014-03-07 MED ORDER — SENNOSIDES-DOCUSATE SODIUM 8.6-50 MG PO TABS
2.0000 | ORAL_TABLET | ORAL | Status: DC
  Administered 2014-03-08 – 2014-03-11 (×4): 2 via ORAL
  Filled 2014-03-07 (×4): qty 2
  Filled 2014-03-07: qty 1

## 2014-03-07 MED ORDER — MENTHOL 3 MG MT LOZG: 1.0000 | LOZENGE | OROMUCOSAL | Status: DC | PRN

## 2014-03-07 MED ORDER — FENTANYL CITRATE 0.05 MG/ML IJ SOLN
INTRAMUSCULAR | Status: AC
  Filled 2014-03-07: qty 2

## 2014-03-07 MED ORDER — OXYTOCIN 10 UNIT/ML IJ SOLN
40.0000 [IU] | INTRAVENOUS | Status: DC | PRN
  Administered 2014-03-07: 40 [IU] via INTRAVENOUS

## 2014-03-07 MED ORDER — ZOLPIDEM TARTRATE 5 MG PO TABS: 5.0000 mg | ORAL_TABLET | Freq: Every evening | ORAL | Status: DC | PRN

## 2014-03-07 MED ORDER — WITCH HAZEL-GLYCERIN EX PADS: 1.0000 "application " | MEDICATED_PAD | CUTANEOUS | Status: DC | PRN

## 2014-03-07 MED ORDER — SCOPOLAMINE 1 MG/3DAYS TD PT72
MEDICATED_PATCH | TRANSDERMAL | Status: AC
  Administered 2014-03-07: 1.5 mg via TRANSDERMAL
  Filled 2014-03-07: qty 1

## 2014-03-07 MED ORDER — CARBOPROST TROMETHAMINE 250 MCG/ML IM SOLN
INTRAMUSCULAR | Status: AC
Start: 2014-03-07 — End: 2014-03-08
  Filled 2014-03-07: qty 1

## 2014-03-07 MED ORDER — NALOXONE HCL 1 MG/ML IJ SOLN
1.0000 ug/kg/h | INTRAVENOUS | Status: DC | PRN
  Filled 2014-03-07: qty 2

## 2014-03-07 MED ORDER — LACTATED RINGERS IV SOLN
INTRAVENOUS | Status: DC
  Administered 2014-03-07 (×4): via INTRAVENOUS

## 2014-03-07 MED ORDER — SIMETHICONE 80 MG PO CHEW
80.0000 mg | CHEWABLE_TABLET | Freq: Three times a day (TID) | ORAL | Status: DC
  Administered 2014-03-08 – 2014-03-11 (×10): 80 mg via ORAL
  Filled 2014-03-07 (×10): qty 1

## 2014-03-07 MED ORDER — OXYTOCIN 40 UNITS IN LACTATED RINGERS INFUSION - SIMPLE MED
62.5000 mL/h | INTRAVENOUS | Status: AC
Start: 2014-03-07 — End: 2014-03-08
  Administered 2014-03-07: 62.5 mL/h via INTRAVENOUS
  Filled 2014-03-07: qty 1000

## 2014-03-07 MED ORDER — DIPHENHYDRAMINE HCL 50 MG/ML IJ SOLN: 25.0000 mg | INTRAMUSCULAR | Status: DC | PRN

## 2014-03-07 MED ORDER — DIPHENHYDRAMINE HCL 25 MG PO CAPS
25.0000 mg | ORAL_CAPSULE | ORAL | Status: DC | PRN
  Filled 2014-03-07: qty 1

## 2014-03-07 SURGICAL SUPPLY — 47 items
BENZOIN TINCTURE PRP APPL 2/3 (GAUZE/BANDAGES/DRESSINGS) ×3 IMPLANT
CANISTER WOUND CARE 500ML ATS (WOUND CARE) IMPLANT
CLAMP CORD UMBIL (MISCELLANEOUS) IMPLANT
CLOSURE WOUND 1/2 X4 (GAUZE/BANDAGES/DRESSINGS) ×1
CLOTH BEACON ORANGE TIMEOUT ST (SAFETY) ×3 IMPLANT
CONTAINER PREFILL 10% NBF 15ML (MISCELLANEOUS) IMPLANT
DRAPE LG THREE QUARTER DISP (DRAPES) IMPLANT
DRSG OPSITE POSTOP 4X10 (GAUZE/BANDAGES/DRESSINGS) ×3 IMPLANT
DRSG VAC ATS LRG SENSATRAC (GAUZE/BANDAGES/DRESSINGS) IMPLANT
DRSG VAC ATS MED SENSATRAC (GAUZE/BANDAGES/DRESSINGS) IMPLANT
DRSG VAC ATS SM SENSATRAC (GAUZE/BANDAGES/DRESSINGS) IMPLANT
DURAPREP 26ML APPLICATOR (WOUND CARE) ×3 IMPLANT
ELECT REM PT RETURN 9FT ADLT (ELECTROSURGICAL) ×3
ELECTRODE REM PT RTRN 9FT ADLT (ELECTROSURGICAL) ×1 IMPLANT
EXTRACTOR VACUUM M CUP 4 TUBE (SUCTIONS) IMPLANT
EXTRACTOR VACUUM M CUP 4' TUBE (SUCTIONS)
GLOVE BIO SURGEON STRL SZ 6.5 (GLOVE) ×2 IMPLANT
GLOVE BIO SURGEONS STRL SZ 6.5 (GLOVE) ×1
GOWN STRL REUS W/TWL LRG LVL3 (GOWN DISPOSABLE) ×6 IMPLANT
KIT ABG SYR 3ML LUER SLIP (SYRINGE) IMPLANT
NEEDLE HYPO 25X5/8 SAFETYGLIDE (NEEDLE) IMPLANT
NS IRRIG 1000ML POUR BTL (IV SOLUTION) ×3 IMPLANT
PACK C SECTION WH (CUSTOM PROCEDURE TRAY) ×3 IMPLANT
PAD ABD 7.5X8 STRL (GAUZE/BANDAGES/DRESSINGS) ×3 IMPLANT
PAD OB MATERNITY 4.3X12.25 (PERSONAL CARE ITEMS) ×3 IMPLANT
RTRCTR C-SECT PINK 25CM LRG (MISCELLANEOUS) ×3 IMPLANT
SCRUB PCMX 4 OZ (MISCELLANEOUS) ×3 IMPLANT
SPONGE GAUZE 4X4 12PLY (GAUZE/BANDAGES/DRESSINGS) ×3 IMPLANT
STAPLER VISISTAT 35W (STAPLE) IMPLANT
STRIP CLOSURE SKIN 1/2X4 (GAUZE/BANDAGES/DRESSINGS) ×2 IMPLANT
SUT MNCRL 0 VIOLET CTX 36 (SUTURE) ×2 IMPLANT
SUT MNCRL AB 3-0 PS2 27 (SUTURE) ×3 IMPLANT
SUT MON AB 2-0 SH 27 (SUTURE) ×4
SUT MON AB 2-0 SH27 (SUTURE) ×2 IMPLANT
SUT MONOCRYL 0 CTX 36 (SUTURE) ×4
SUT PDS AB 0 CTX 36 PDP370T (SUTURE) ×3 IMPLANT
SUT PDS AB 0 CTX 60 (SUTURE) ×3 IMPLANT
SUT PLAIN 0 NONE (SUTURE) IMPLANT
SUT VIC AB 0 CTXB 36 (SUTURE) IMPLANT
SUT VIC AB 2-0 CT1 (SUTURE) ×3 IMPLANT
SUT VIC AB 2-0 CT1 27 (SUTURE) ×2
SUT VIC AB 2-0 CT1 TAPERPNT 27 (SUTURE) ×1 IMPLANT
SUT VIC AB 2-0 SH 27 (SUTURE)
SUT VIC AB 2-0 SH 27XBRD (SUTURE) IMPLANT
TOWEL OR 17X24 6PK STRL BLUE (TOWEL DISPOSABLE) ×3 IMPLANT
TRAY FOLEY CATH 14FR (SET/KITS/TRAYS/PACK) ×3 IMPLANT
WATER STERILE IRR 1000ML POUR (IV SOLUTION) IMPLANT

## 2014-03-07 NOTE — Consult Note (Signed)
Neonatology Note:   Attendance at C-section:    I was asked by Dr. Delsa Sale to attend this repeat C/S at term. The mother is a G7P3A3 B pos, GBS neg with breech presentation, chronic HTN with superimposed pre-eclampsia, multiple sclerosis, and obesity. ROM at delivery, fluid with thin meconium. Infant vigorous with good spontaneous cry and tone. Needed bulb suctioning several times for copious oral secretions. Ap 8/9. Lungs clear to ausc in DR. Infant appears quite SGA with a birth weight of 4# 8.5 oz. I counseled the parents about the risk for hypoglycemia and hypothermia in the SGA infant, but she appears vigorous and well, so will place her on CN status for now. To CN to care of Pediatrician.   Real Cons, MD

## 2014-03-07 NOTE — Anesthesia Preprocedure Evaluation (Addendum)
Anesthesia Evaluation    Airway Mallampati: III TM Distance: >3 FB Neck ROM: Full    Dental no notable dental hx. (+) Teeth Intact   Pulmonary  breath sounds clear to auscultation  Pulmonary exam normal       Cardiovascular hypertension, Pt. on medications and Pt. on home beta blockers Rhythm:Regular Rate:Normal     Neuro/Psych Hx/ of MS diagnosed in 11/2008 relatively quiescent until last 2 weeks - some numbness fingers right hand Off all medications during pregnancy negative psych ROS   GI/Hepatic Neg liver ROS, GERD-  Medicated and Controlled,  Endo/Other  Morbid obesity  Renal/GU negative Renal ROS  negative genitourinary   Musculoskeletal negative musculoskeletal ROS (+)   Abdominal (+) + obese,   Peds  Hematology   Anesthesia Other Findings   Reproductive/Obstetrics (+) Pregnancy Previous C/Section Breech presentation 37 weeks + Pre eclampsia on cHTN                          Anesthesia Physical Anesthesia Plan  ASA: III  Anesthesia Plan: Epidural   Post-op Pain Management:    Induction:   Airway Management Planned: Natural Airway  Additional Equipment:   Intra-op Plan:   Post-operative Plan:   Informed Consent: I have reviewed the patients History and Physical, chart, labs and discussed the procedure including the risks, benefits and alternatives for the proposed anesthesia with the patient or authorized representative who has indicated his/her understanding and acceptance.     Plan Discussed with: CRNA, Anesthesiologist and Surgeon  Anesthesia Plan Comments:        Anesthesia Quick Evaluation

## 2014-03-07 NOTE — Op Note (Signed)
Cesarean Section Procedure Note   [name]   03/07/2014  Indications: Intrauterine pregnancy at 37+ weeks, chronic hypertension with superimposed preeclampsia, breech presentation, desires sterilization   Pre-operative Diagnosis: Intrauterine pregnancy at 37+ weeks, chronic hypertension with superimposed preeclampsia, breech presentation, desires sterilization  Post-operative Diagnosis: Same   Surgeon: Agnes Lawrence  Assistants: Baltazar Najjar A  Anesthesia: spinal  Procedure Details:  The patient was seen in the Holding Room. The risks, benefits, complications, treatment options, and expected outcomes were discussed with the patient. The patient concurred with the proposed plan, giving informed consent. The patient was identified as Angel Phelps and the procedure verified as C-Section Delivery. A Time Out was held and the above information confirmed.  After induction of anesthesia, the patient was draped and prepped in the usual sterile manner. A transverse incision was made and carried down through the subcutaneous tissue to the fascia. The fascial incision was made and extended transversely. The fascia was separated from the underlying rectus tissue superiorly. The peritoneum was identified and entered. An Alexis retractor was placed into the incision.  The peritoneal incision was extended longitudinally. The utero-vesical peritoneal reflection was incised transversely and the bladder flap was sharp   ly freed from the lower uterine segment. A low transverse uterine incision was made. Delivered from breech presentation was a 2050 gram living newborn female infant. APGAR (1 MIN): 8   APGAR (5 MINS): 9    A cord ph was sent. The umbilical cord was clamped and cut cord. A sample was obtained for evaluation. The placenta was removed Intact and appeared normal.  The uterine incision was closed with a running locked suture of 1-0 Monocryl.  Hemostasis was observed.  The left  fallopian tube was grasped with a Babcock clamp.  The mesosalpinx and proximal fallopian tube were clamped and divided.  The pedicles were suture ligated with 2-0 Vicryl.   The parieto peritoneum was closed in a running fashion with 2-0 Vicryl.  The fascia was then reapproximated with running sutures of 0 PDS.  The skin was closed with suture.  Instrument, sponge, and needle counts were correct prior the abdominal closure and were correct at the conclusion of the case.    Findings: Complete breech presentation, meconium stained fluid, right fallopian tube absent   Estimated Blood Loss: 600 ml  Total IV Fluids: per Anesthesiology   Urine Output: per Anesthesiology   Specimens: Placenta to Pathology  Complications: no complications  Disposition: PACU - hemodynamically stable.  Maternal Condition: stable   Baby condition / location:  Couplet care / Skin to Skin    Signed: Surgeon(s): Lahoma Crocker, MD Shelly Bombard, MD

## 2014-03-07 NOTE — Anesthesia Postprocedure Evaluation (Signed)
Anesthesia Post Note  Patient: Angel Phelps  Procedure(s) Performed: Procedure(s) (LRB): REPEAT CESAREAN SECTION and bilateral salpingectomy (N/A)  Anesthesia type: Epidural  Patient location: PACU  Post pain: Pain level controlled  Post assessment: Post-op Vital signs reviewed  Last Vitals:  Filed Vitals:   03/07/14 1431  BP:   Pulse:   Temp: 36.9 C  Resp: 16    Post vital signs: Reviewed  Level of consciousness: awake  Complications: No apparent anesthesia complications

## 2014-03-07 NOTE — OR Nursing (Signed)
Baby was weighed in the OR by neonatologist. Weight was 2050g (4lb 8.3oz).

## 2014-03-07 NOTE — Anesthesia Procedure Notes (Signed)
Epidural Patient location during procedure: OB Start time: 03/07/2014 12:36 PM End time: 03/07/2014 12:40 PM  Staffing Anesthesiologist: Lyn Hollingshead Performed by: anesthesiologist   Preanesthetic Checklist Completed: patient identified, surgical consent, pre-op evaluation, timeout performed, IV checked, risks and benefits discussed and monitors and equipment checked  Epidural Patient position: sitting Prep: site prepped and draped and DuraPrep Patient monitoring: continuous pulse ox and blood pressure Approach: midline Location: L3-L4 Injection technique: LOR air  Needle:  Needle type: Tuohy  Needle gauge: 17 G Needle length: 9 cm and 9 Needle insertion depth: 7 cm Catheter type: closed end flexible Catheter size: 19 Gauge Catheter at skin depth: 12 cm Test dose: negative and 2% lidocaine with Epi 1:200 K  Assessment Sensory level: T10 Events: blood not aspirated, injection not painful, no injection resistance, negative IV test and no paresthesia  Additional Notes Reason for block:surgical anesthesia

## 2014-03-07 NOTE — Transfer of Care (Signed)
Immediate Anesthesia Transfer of Care Note  Patient: Angel Phelps  Procedure(s) Performed: Procedure(s): REPEAT CESAREAN SECTION and bilateral salpingectomy (N/A)  Patient Location: PACU  Anesthesia Type:Epidural  Level of Consciousness: awake, alert  and oriented  Airway & Oxygen Therapy: Patient Spontanous Breathing  Post-op Assessment: Report given to PACU RN and Post -op Vital signs reviewed and stable  Post vital signs: Reviewed and stable  Complications: No apparent anesthesia complications

## 2014-03-07 NOTE — Lactation Note (Signed)
This note was copied from the chart of Angel Phelps. Lactation Consultation Note Assisted mom with breastfeeding for 25 to 30 minutes on left breast in football hold.  Towards end of feeding mom became nauseated, hot, dizzy and felt like she was going to faint.  RN notified.  Broke suction after 30 minutes and father of baby wanted to hold. Patient Name: Angel Phelps Today's Date: 03/07/2014     Maternal Data    Feeding Feeding Type: Breast Fed Length of feed: 30 min  LATCH Score/Interventions Latch: Repeated attempts needed to sustain latch, nipple held in mouth throughout feeding, stimulation needed to elicit sucking reflex. Intervention(s): Adjust position;Assist with latch;Breast massage;Breast compression  Audible Swallowing: None Intervention(s): Skin to skin;Hand expression Intervention(s): Skin to skin;Hand expression;Alternate breast massage  Type of Nipple: Everted at rest and after stimulation  Comfort (Breast/Nipple): Soft / non-tender     Hold (Positioning): Assistance needed to correctly position infant at breast and maintain latch. Intervention(s): Breastfeeding basics reviewed;Support Pillows;Position options;Skin to skin  LATCH Score: 6  Lactation Tools Discussed/Used     Consult Status      Jarold Motto 03/07/2014, 6:19 PM

## 2014-03-07 NOTE — H&P (Signed)
Angel Phelps is Phelps 38 y.o. female presenting for Phelps scheduled repeat C/D. Maternal Medical History:  Reason for admission: The patient presents for Phelps repeat C/D secondary to breech presentation in the setting of chronic hypertension with superimposed preeclampsia.  Fetal activity: Perceived fetal activity is normal.    Prenatal complications: PIH and preterm labor.   Prenatal Complications - Diabetes: none.    OB History   Grav Para Term Preterm Abortions TAB SAB Ect Mult Living   7 3 2 1 3  2 1  3      Past Medical History  Diagnosis Date  . HTN in pregnancy, chronic     Took Labetol, G3P1001 term female, no comps, miscarried, 1st trimester, failed IUD [facility]) 11/04  . Hypertension   . Obesity   . Multiple sclerosis   . Preterm labor   . Multiple sclerosis    Past Surgical History  Procedure Laterality Date  . Gravida      4/para2/miscarriged2; preeclamptic #2 pregnancy  . Spinal tap  11/27/2008  . Mri  11/18/2008    Head, abn  . Cesarean section    . Cholecystectomy    . Salpingectomy      R tube  . Dilation and curettage of uterus     Family History: family history includes Diabetes in her father; Heart attack in her maternal grandmother; Heart attack (age of onset: 4) in her mother; Hyperlipidemia in her father; Hypertension in her father, maternal grandmother, and mother; Seizures in her brother. There is no history of Anesthesia problems. Social History:  reports that she has never smoked. She has never used smokeless tobacco. She reports that she does not drink alcohol or use illicit drugs.     Review of Systems  Constitutional: Negative for fever.  Eyes: Negative for blurred vision.  Respiratory: Negative for shortness of breath.   Gastrointestinal: Negative for vomiting.  Skin: Negative for rash.  Neurological: Negative for headaches.      There were no vitals taken for this visit. Maternal Exam:  Abdomen: Patient reports no abdominal tenderness. Fetal  presentation: breech  Introitus: not evaluated.   Cervix: not evaluated.   Fetal Exam Fetal Monitor Review: Mode: hand-held doppler probe.       Physical Exam  Constitutional: She appears well-developed.  HENT:  Head: Normocephalic.  Neck: Neck supple. No thyromegaly present.  Cardiovascular: Normal rate and regular rhythm.   Respiratory: Breath sounds normal.  GI: Soft. Bowel sounds are normal.  Skin: No rash noted.    Prenatal labs: ABO, Rh: --/--/B POS (03/09 2000) Antibody: NEG (03/09 2000) Rubella: 8.17 (10/22 1352) RPR: NON REACTIVE (03/09 2000)  HBsAg: NEGATIVE (10/22 1352)  HIV: NON REACTIVE (01/06 1035)  GBS: NEGATIVE (03/09 1727)   Assessment/Plan: Multipara @ [redacted]w[redacted]d  H/O previous C/D, breech presentation.  Chronic hypertension with superimposed preeclampsia.  Admit Repeat C/D   Angel Phelps 03/07/2014, 9:31 AM

## 2014-03-07 NOTE — Progress Notes (Signed)
Fundus check 15 min. After Hemabate injectiom. Hold b/p med if <130/80. Order received from Dr. Delsa Sale

## 2014-03-08 ENCOUNTER — Encounter (HOSPITAL_COMMUNITY): Payer: Self-pay | Admitting: *Deleted

## 2014-03-08 LAB — CBC
HCT: 30.2 % — ABNORMAL LOW (ref 36.0–46.0)
Hemoglobin: 10.8 g/dL — ABNORMAL LOW (ref 12.0–15.0)
MCH: 33.5 pg (ref 26.0–34.0)
MCHC: 35.8 g/dL (ref 30.0–36.0)
MCV: 93.8 fL (ref 78.0–100.0)
Platelets: 211 10*3/uL (ref 150–400)
RBC: 3.22 MIL/uL — ABNORMAL LOW (ref 3.87–5.11)
RDW: 13.2 % (ref 11.5–15.5)
WBC: 12.5 10*3/uL — ABNORMAL HIGH (ref 4.0–10.5)

## 2014-03-08 NOTE — Progress Notes (Signed)
Patient and husband given formula along with sheet with instructions on amount to give baby depending upon age in hours, with dates wrote beside hours. First bottle made and assisted mom in feeding baby.

## 2014-03-08 NOTE — Lactation Note (Signed)
This note was copied from the chart of Angel Jonell Butch. Lactation Consultation Note   Initial consul twith this mom of a [redacted] week gestation, 4 1/2 pound baby. [place]. Mom reports baby at the breast for 30 minutes this morning, with about 15 minutes of sucking. The baby is 22 hours old, and has voided 3 times and stooled once already. Mom has easily expressed colostrum. She has started pumping with DEP, followed by had expressiog. I showed mom how to spoon feed, and hand express,and advised that she pump in premie setting, 15 minutes, every 3 hours, and hand express after. Whatever she expresses she will feed to the baby, and then breast feed on cue, and at least every 3 hours will attempt. Lactation and community breast feeding services reviewed with mom. Mom knows to call for questions/concerns. Baby and me book breast feeding pages also reviewed with mom.  Patient Name: Angel Phelps Today's Date: 03/08/2014 Reason for consult: Initial assessment;Infant < 6lbs;Late preterm infant   Maternal Data Formula Feeding for Exclusion: Yes (mom in AICU) Reason for exclusion: Admission to Intensive Care Unit (ICU) post-partum Infant to breast within first hour of birth: No Breastfeeding delayed due to:: Maternal status Has patient been taught Hand Expression?: Yes Does the patient have breastfeeding experience prior to this delivery?: Yes  Feeding Feeding Type: Breast Milk Length of feed: 5 min  LATCH Score/Interventions Latch: Too sleepy or reluctant, no latch achieved, no sucking elicited. Intervention(s): Skin to skin;Teach feeding cues;Waking techniques Intervention(s): Adjust position;Assist with latch  Audible Swallowing: None Intervention(s): Skin to skin;Hand expression  Type of Nipple: Everted at rest and after stimulation (baby samll, nipple large, but baby appears to latch beyond mom's nipple)  Comfort (Breast/Nipple): Soft / non-tender     Hold (Positioning):  Assistance needed to correctly position infant at breast and maintain latch. Intervention(s): Breastfeeding basics reviewed;Support Pillows;Position options;Skin to skin  LATCH Score: 5  Lactation Tools Discussed/Used Tools: Pump Breast pump type: Double-Electric Breast Pump WIC Program: No Pump Review: Setup, frequency, and cleaning;Milk Storage;Other (comment) (premie setting, hand expression, spoon feeding) Initiated by:: clee rn lc Date initiated:: 03/08/14   Consult Status Consult Status: Follow-up Date: 03/09/14 Follow-up type: In-patient    Tonna Corner 03/08/2014, 10:09 AM

## 2014-03-08 NOTE — Progress Notes (Signed)
UR chart review completed.  

## 2014-03-08 NOTE — Anesthesia Postprocedure Evaluation (Signed)
  Anesthesia Post-op Note  Patient: Angel Phelps  Procedure(s) Performed: Procedure(s): REPEAT CESAREAN SECTION and bilateral salpingectomy (N/A)  Patient Location: PACU and A-ICU  Anesthesia Type:Epidural  Level of Consciousness: awake, alert , oriented and patient cooperative  Airway and Oxygen Therapy: Patient Spontanous Breathing  Post-op Pain: none  Post-op Assessment: Post-op Vital signs reviewed, Patient's Cardiovascular Status Stable, Respiratory Function Stable, No signs of Nausea or vomiting, Pain level controlled, No headache and No residual numbness  Post-op Vital Signs: Reviewed and stable  Complications: No apparent anesthesia complications

## 2014-03-08 NOTE — Brief Op Note (Signed)
Placenta sent to pathology per physician order.  Manual patient slip made for specimen.

## 2014-03-08 NOTE — Progress Notes (Signed)
Subjective: Postpartum Day 1: Cesarean Delivery Patient reports tolerating PO.    Objective: Vital signs in last 24 hours: Temp:  [97.3 F (36.3 C)-98.5 F (36.9 C)] 97.4 F (36.3 C) (03/26 0100) Pulse Rate:  [60-93] 71 (03/26 0500) Resp:  [16-21] 20 (03/25 2120) BP: (96-161)/(63-111) 137/68 mmHg (03/26 0700) SpO2:  [96 %-100 %] 98 % (03/26 0500) Weight:  [214 lb 8 oz (97.297 kg)-224 lb (101.606 kg)] 214 lb 8 oz (97.297 kg) (03/25 1950)  Physical Exam:  General: alert and no distress Lochia: appropriate Uterine Fundus: firm Incision: healing well DVT Evaluation: No evidence of DVT seen on physical exam.   Recent Labs  03/07/14 1058 03/08/14 0527  HGB 13.3 10.8*  HCT 37.8 30.2*    Assessment/Plan: Status post Cesarean section. Doing well postoperatively.  Continue current care.  HARPER,CHARLES A 03/08/2014, 8:54 AM

## 2014-03-08 NOTE — Addendum Note (Signed)
Addendum created 03/08/14 0849 by Alvy Bimler, CRNA   Modules edited: Notes Section   Notes Section:  File: [id number]

## 2014-03-09 NOTE — Progress Notes (Signed)
Subjective: Postpartum Day 2: Cesarean Delivery Patient reports tolerating PO, + flatus and no problems voiding.    Objective: Vital signs in last 24 hours: Temp:  [98.1 F (36.7 C)-98.7 F (37.1 C)] 98.4 F (36.9 C) (03/27 0750) Pulse Rate:  [72-83] 77 (03/27 0614) Resp:  [16-20] 16 (03/27 0750) BP: (110-145)/(46-84) 145/73 mmHg (03/27 0750) SpO2:  [94 %-99 %] 98 % (03/27 0750)  Physical Exam:  General: alert and no distress Lochia: appropriate Uterine Fundus: firm Incision: healing well DVT Evaluation: No evidence of DVT seen on physical exam.   Recent Labs  03/07/14 1058 03/08/14 0527  HGB 13.3 10.8*  HCT 37.8 30.2*    Assessment/Plan: Status post Cesarean section. Doing well postoperatively.  Continue current care.  HARPER,CHARLES A 03/09/2014, 8:17 AM

## 2014-03-09 NOTE — Progress Notes (Signed)
Pt ambulated to Rm #[id number] - Annamaria Boots, RN updated

## 2014-03-10 MED ORDER — OXYCODONE HCL 10 MG PO TABS: 10.0000 mg | ORAL_TABLET | Freq: Four times a day (QID) | ORAL | Status: DC | PRN

## 2014-03-10 MED ORDER — IBUPROFEN 600 MG PO TABS: 600.0000 mg | ORAL_TABLET | Freq: Four times a day (QID) | ORAL | Status: DC | PRN

## 2014-03-10 NOTE — Progress Notes (Signed)
Subjective: Postpartum Day 3: Cesarean Delivery Patient reports tolerating PO, + flatus and no problems voiding.    Objective: Vital signs in last 24 hours: Temp:  [98.4 F (36.9 C)-99.2 F (37.3 C)] 99.2 F (37.3 C) (03/27 1819) Pulse Rate:  [79-96] 92 (03/27 2121) Resp:  [16-18] 18 (03/27 1819) BP: (119-155)/(73-91) 155/91 mmHg (03/27 2121) SpO2:  [98 %-99 %] 98 % (03/27 1200) Weight:  [209 lb 6.4 oz (94.983 kg)] 209 lb 6.4 oz (94.983 kg) (03/27 0836)  Physical Exam:  General: alert and no distress Lochia: appropriate Uterine Fundus: firm Incision: healing well DVT Evaluation: No evidence of DVT seen on physical exam.   Recent Labs  03/07/14 1058 03/08/14 0527  HGB 13.3 10.8*  HCT 37.8 30.2*    Assessment/Plan: Status post Cesarean section. Doing well postoperatively.  Discharge home with standard precautions and return to clinic in 2 weeks.  Bonnye Halle A 03/10/2014, 6:19 AM

## 2014-03-10 NOTE — Discharge Summary (Signed)
Obstetric Discharge Summary Reason for Admission: cesarean section Prenatal Procedures: ultrasound Intrapartum Procedures: cesarean: low cervical, transverse Postpartum Procedures: none Complications-Operative and Postpartum: none Hemoglobin  Date Value Ref Range Status  03/08/2014 10.8* 12.0 - 15.0 g/dL Final     REPEATED TO VERIFY     DELTA CHECK NOTED     HCT  Date Value Ref Range Status  03/08/2014 30.2* 36.0 - 46.0 % Final    Physical Exam:  General: alert and no distress Lochia: appropriate Uterine Fundus: firm Incision: healing well DVT Evaluation: No evidence of DVT seen on physical exam.  Discharge Diagnoses: Term Pregnancy-delivered  Discharge Information: Date: 03/10/2014 Activity: pelvic rest Diet: routine Medications: PNV, Ibuprofen and Colace and Oxycodone Condition: stable Instructions: refer to practice specific booklet Discharge to: home Follow-up Information   Follow up with Lahoma Crocker A, MD In 2 weeks.   Specialty:  Obstetrics and Gynecology   Contact information:   [address] [place] [address] [phone]       Newborn Data: Live born female  Birth Weight: 4 lb 8.3 oz (2050 g) APGAR: 8, 9  Home with mother.  Topacio Cella A 03/10/2014, 6:25 AM

## 2014-03-11 ENCOUNTER — Encounter (HOSPITAL_COMMUNITY)
Admission: RE | Admit: 2014-03-11 | Discharge: 2014-03-11 | Disposition: A | Discharge status: Home / Self Care | Payer: BC Managed Care – PPO | Source: Ambulatory Visit | Attending: Obstetrics & Gynecology | Admitting: Obstetrics & Gynecology

## 2014-03-11 DIAGNOSIS — O923 Agalactia: Secondary | ICD-10-CM | POA: Insufficient documentation

## 2014-03-13 ENCOUNTER — Ambulatory Visit (INDEPENDENT_AMBULATORY_CARE_PROVIDER_SITE_OTHER): Payer: BC Managed Care – PPO | Admitting: Obstetrics

## 2014-03-13 NOTE — Progress Notes (Signed)
Subjective:     Angel Phelps is a 38 y.o. female who presents for a postpartum visit. She is 1 weeks postpartum following a low cervical transverse Cesarean section. I have fully reviewed the prenatal and intrapartum course. The delivery was at 37.5 gestational weeks. Outcome: repeat cesarean section, low transverse incision. Anesthesia: spinal. Postpartum course has been doing well.  Pt has had some numbness in finger since pregnancy.  Pt states that this has progressively gotten worse. Baby's course has been doing well. Baby is feeding by both breast and bottle - Carn[employer] Good Start. Bleeding thin lochia. Bowel function is abnormal: constipation. Bladder function is normal. Patient is not sexually active. Contraception method is none. Postpartum depression screening: negative.  The following portions of the patient's history were reviewed and updated as appropriate: allergies, current medications, past family history, past medical history, past social history, past surgical history and problem list.  Review of Systems Pertinent items are noted in HPI.   Objective:    There were no vitals taken for this visit.  General:  alert and no distress Breasts:  Negative Abdomen:  Incision C, D, I.  Soft, NT.   Assessment:     Normal postpartum exam. Pap smear not done at today's visit.   Plan:    1. Contraception: abstinence 2. Continue PNV's 3. Follow up in: 2 weeks or as needed.

## 2014-03-14 ENCOUNTER — Ambulatory Visit: Payer: BC Managed Care – PPO | Admitting: Obstetrics & Gynecology

## 2014-03-14 ENCOUNTER — Encounter: Payer: Self-pay | Admitting: Obstetrics & Gynecology

## 2014-03-18 ENCOUNTER — Encounter: Payer: Self-pay | Admitting: Obstetrics

## 2014-03-22 ENCOUNTER — Encounter: Payer: Self-pay | Admitting: Obstetrics

## 2014-03-22 ENCOUNTER — Ambulatory Visit (INDEPENDENT_AMBULATORY_CARE_PROVIDER_SITE_OTHER): Payer: BC Managed Care – PPO | Admitting: Obstetrics

## 2014-03-22 NOTE — Progress Notes (Signed)
Subjective:     Angel Phelps is a 38 y.o. female here for a routine exam.  Current complaints: pt states that concern about c/s incision. Pt states that she feels a lump on the left side.  Pt states that she has also had slight blood from left side of site with an incision opening.  .     The HPI was reviewed and explored in further detail by the provider. Gynecologic History No LMP recorded.   Obstetric History OB History  Gravida Para Term Preterm AB SAB TAB Ectopic Multiple Living  7 4 3 1 3 2  1  4     # Outcome Date GA Lbr Len/2nd Weight Sex Delivery Anes PTL Lv  7 TRM 03/07/14 [redacted]w[redacted]d 4 lb 8.3 oz (2.05 kg) F LTCS EPI  Y  6 TRM 10/13/12 36w5d05:58 / 01:18 6 lb 14.4 oz (3.13 kg) M VBAC EPI  Y  5 ECT 12/14/09          4 PRE 04/02/05 2983w0d lb (1.361 kg) M LTCS  Y Y     Comments: preterm, pre eclampsia  3 SAB 12/15/03             Comments: Patient had an IUD.  2 TRM 06/01/03 [id number] 10 oz (3.459 kg) M SVD None  Y     Comments: high blood pressure  1 SAB 12/14/01 60w0d30w0d         The following portions of the patient's history were reviewed and updated as appropriate: allergies, current medications, past family history, past medical history, past social history, past surgical history and problem list.  Review of Systems A comprehensive review of systems was negative except for: Gastrointestinal: positive for pain and possible bleeding from C/S incision    Objective:    General appearance: alert and no distress Head: Normocephalic, without obvious abnormality, atraumatic Eyes: conjunctivae/corneas clear. PERRL, EOM's intact. Fundi benign. Abdomen: normal findings: soft, non-tender and incision C, D, I.      Assessment:    S/P cesarean section.  Doing well.   Plan:    Education reviewed: Care after C/S.. Follow up in: 6 weeks.

## 2014-04-03 ENCOUNTER — Encounter: Payer: Self-pay | Admitting: Obstetrics

## 2014-04-08 ENCOUNTER — Other Ambulatory Visit: Payer: Self-pay | Admitting: Obstetrics & Gynecology

## 2014-04-11 ENCOUNTER — Encounter (HOSPITAL_COMMUNITY)
Admission: RE | Admit: 2014-04-11 | Discharge: 2014-04-11 | Disposition: A | Discharge status: Home / Self Care | Payer: BC Managed Care – PPO | Source: Ambulatory Visit | Attending: Obstetrics & Gynecology | Admitting: Obstetrics & Gynecology

## 2014-04-11 DIAGNOSIS — O923 Agalactia: Secondary | ICD-10-CM | POA: Insufficient documentation

## 2014-04-23 ENCOUNTER — Ambulatory Visit: Payer: BC Managed Care – PPO | Admitting: Obstetrics & Gynecology

## 2014-04-30 ENCOUNTER — Encounter: Payer: Self-pay | Admitting: Obstetrics & Gynecology

## 2014-04-30 ENCOUNTER — Ambulatory Visit (INDEPENDENT_AMBULATORY_CARE_PROVIDER_SITE_OTHER): Payer: BC Managed Care – PPO | Admitting: Obstetrics & Gynecology

## 2014-04-30 NOTE — Progress Notes (Signed)
Subjective:     Angel Phelps is a 38 y.o. female who presents for a postpartum visit. She is 6 weeks postpartum following a low cervical transverse Cesarean section. I have fully reviewed the prenatal and intrapartum course.  Outcome: repeat cesarean section, low transverse incision. Anesthesia: spinal. Postpartum course has been uneventful. Baby's course has been unremarkable.  Bowel function is normal. Bladder function is normal.  Contraception method is salpingectomy. Postpartum depression screening: negative.  The following portions of the patient's history were reviewed and updated as appropriate: allergies, current medications, past family history, past medical history, past social history, past surgical history and problem list.  Review of Systems Pertinent items are noted in HPI.   Objective:    BP 152/94  Pulse 86  Temp(Src) 97.9 F (36.6 C)  Ht 5' 4"  (1.626 m)  Wt 91.173 kg (201 lb)  BMI 34.48 kg/m2  LMP 04/13/2014  Breastfeeding? No        General:   alert  Skin:   no rash or abnormalities  Lungs:   clear to auscultation bilaterally  Heart:   regular rate and rhythm, S1, S2 normal, no murmur, click, rub or gallop  Ext 2+ edema  Abdomen:  normal findings: no organomegaly, soft, non-tender and no hernia; incision well-healed  Pelvis:  External genitalia: normal general appearance Urinary system: urethral meatus normal and bladder without fullness, nontender Vaginal: normal without tenderness, induration or masses Cervix: normal appearance Adnexa: normal bimanual exam Uterus: anteverted and non-tender, normal size     Assessment:     Normal postpartum exam. H/O chronic HTN--B/P stable  Plan:     Contraception: see above Resume antihypertensive regimen prior to pregnancy  Follow up in: 6 months or as needed.

## 2014-05-04 ENCOUNTER — Telehealth: Payer: Self-pay | Admitting: *Deleted

## 2014-05-04 NOTE — Telephone Encounter (Signed)
Pt called in to office to make Dr Delsa Sale aware of her BP medication that was used prior to pregnancy.   Medication :  Micardis HCT 80/42m.

## 2014-05-05 NOTE — Telephone Encounter (Signed)
Please refill. Stop b/p meds during pregnancy

## 2014-05-08 ENCOUNTER — Other Ambulatory Visit: Payer: Self-pay | Admitting: *Deleted

## 2014-05-08 DIAGNOSIS — I10 Essential (primary) hypertension: Secondary | ICD-10-CM

## 2014-05-08 MED ORDER — TELMISARTAN-HCTZ 80-25 MG PO TABS: 1.0000 | ORAL_TABLET | Freq: Every day | ORAL | Status: DC

## 2014-05-08 NOTE — Telephone Encounter (Signed)
Pt made aware of change in Rx and Micardis 80-74m was sent to pharmacy.  Pt also made aware to discontiue BP medication that she was taking during pregnancy.  Pt states that she is making a follow up appt with PCP for medication mangaement.

## 2014-05-09 ENCOUNTER — Encounter: Payer: Self-pay | Admitting: *Deleted

## 2014-08-08 ENCOUNTER — Telehealth: Payer: Self-pay | Admitting: *Deleted

## 2014-08-08 NOTE — Telephone Encounter (Signed)
Patient called stating she is having a problem with her cycles, bleeding more.  CB: Patient states she knows it is common to have bleeding after having a baby but that she is having 2 cycles a month. With a light to medium flow. Patient states she had to call out of work today because she felt weak. Patient states she is also having vaginal itching. Patient schedule for an appointment tomorrow at 1:00pm.

## 2014-08-09 ENCOUNTER — Ambulatory Visit (INDEPENDENT_AMBULATORY_CARE_PROVIDER_SITE_OTHER): Payer: BC Managed Care – PPO | Admitting: Obstetrics & Gynecology

## 2014-08-09 ENCOUNTER — Encounter: Payer: Self-pay | Admitting: Obstetrics & Gynecology

## 2014-08-09 ENCOUNTER — Telehealth: Payer: Self-pay

## 2014-08-09 VITALS — BP 149/99 | HR 84 | Temp 97.9°F | Ht 64.0 in | Wt 198.0 lb

## 2014-08-09 DIAGNOSIS — N939 Abnormal uterine and vaginal bleeding, unspecified: Secondary | ICD-10-CM

## 2014-08-09 DIAGNOSIS — B379 Candidiasis, unspecified: Secondary | ICD-10-CM

## 2014-08-09 DIAGNOSIS — N926 Irregular menstruation, unspecified: Secondary | ICD-10-CM

## 2014-08-09 LAB — CBC
HCT: 38.7 % (ref 36.0–46.0)
Hemoglobin: 13.6 g/dL (ref 12.0–15.0)
MCH: 31 pg (ref 26.0–34.0)
MCHC: 35.1 g/dL (ref 30.0–36.0)
MCV: 88.2 fL (ref 78.0–100.0)
PLATELETS: 376 10*3/uL (ref 150–400)
RBC: 4.39 MIL/uL (ref 3.87–5.11)
RDW: 13.5 % (ref 11.5–15.5)
WBC: 6.2 10*3/uL (ref 4.0–10.5)

## 2014-08-09 MED ORDER — PROGESTERONE MICRONIZED 200 MG PO CAPS: 200.0000 mg | ORAL_CAPSULE | Freq: Every day | ORAL | Status: DC

## 2014-08-09 MED ORDER — FLUCONAZOLE 150 MG PO TABS: 150.0000 mg | ORAL_TABLET | Freq: Once | ORAL | Status: DC

## 2014-08-09 NOTE — Progress Notes (Signed)
Angel Phelps is a 38 y.o.who presents for irregular menses. Patient's last menstrual period was 8/25 .  Periods are irregular, lasting 3 days.   Patient Active Problem List   Diagnosis Date Noted  . GOITER, UNSPECIFIED 05/17/2009  . MULTIPLE SCLEROSIS 11/13/2008  . OBESITY 10/25/2007  . LYMPHANGITIS 10/25/2007  . HYPERTENSION 03/10/2007   Past Medical History  Diagnosis Date  . HTN in pregnancy, chronic     Took Labetol, G3P1001 term female, no comps, miscarried, 1st trimester, failed IUD [facility]) 11/04  . Hypertension   . Obesity   . Multiple sclerosis   . Preterm labor   . Multiple sclerosis     Past Surgical History  Procedure Laterality Date  . Gravida      4/para2/miscarriged2; preeclamptic #2 pregnancy  . Spinal tap  11/27/2008  . Mri  11/18/2008    Head, abn  . Cesarean section    . Cholecystectomy    . Salpingectomy      R tube  . Dilation and curettage of uterus    . Cesarean section N/A 03/07/2014    Procedure: REPEAT CESAREAN SECTION and bilateral salpingectomy;  Surgeon: Lahoma Crocker, MD;  Location: [facility];  Service: Obstetrics;  Laterality: N/A;    Current outpatient prescriptions:amLODipine (NORVASC) 10 MG tablet, Take 1 tablet (10 mg total) by mouth daily., Disp: 30 tablet, Rfl: 3;  ibuprofen (ADVIL,MOTRIN) 600 MG tablet, Take 1 tablet (600 mg total) by mouth every 6 (six) hours as needed., Disp: 30 tablet, Rfl: 5;  valsartan-hydrochlorothiazide (DIOVAN-HCT) 160-25 MG per tablet, , Disp: , Rfl:  fluconazole (DIFLUCAN) 150 MG tablet, Take 1 tablet (150 mg total) by mouth once. Every other day., Disp: 2 tablet, Rfl: 0;  progesterone (PROMETRIUM) 200 MG capsule, Take 1 capsule (200 mg total) by mouth daily. For 12 days, Disp: 36 capsule, Rfl: 1 No Known Allergies  History  Substance Use Topics  . Smoking status: Never Smoker   . Smokeless tobacco: Never Used  . Alcohol Use: No    Family History  Problem Relation Age of Onset  . Hypertension Mother   .  Heart attack Mother 6    Massive MI  . Hypertension Father   . Hyperlipidemia Father   . Diabetes Father   . Seizures Brother   . Heart attack Maternal Grandmother     MI  . Hypertension Maternal Grandmother   . Anesthesia problems Neg Hx      Review of Systems Constitutional: negative for fatigue and weight loss Respiratory: negative for cough and wheezing Cardiovascular: negative for chest pain, fatigue and palpitations Gastrointestinal: negative for abdominal pain and change in bowel habits Genitourinary: positive for abnormal uterine bleeding Integument/breast: negative for nipple discharge Musculoskeletal:negative for myalgias Neurological: negative for gait problems and tremors Behavioral/Psych: negative for abusive relationship, depression Endocrine: negative for temperature intolerance     Lab Review  Labs reviewed no Radiologic studies reviewed no  Objective:  BP 149/99  Pulse 84  Temp(Src) 97.9 F (36.6 C)  Ht 5' 4"  (1.626 m)  Wt 89.812 kg (198 lb)  BMI 33.97 kg/m2  LMP 08/07/2014  Breastfeeding? No General:   alert  Skin:   no rash or abnormalities  Lungs:   clear to auscultation bilaterally  Heart:   regular rate and rhythm, S1, S2 normal, no murmur, click, rub or gallop  Abdomen:  normal findings: no organomegaly, soft, non-tender and no hernia  Pelvis:  External genitalia: normal general appearance Urinary system: urethral meatus normal and  bladder without fullness, nontender Vaginal: normal without tenderness, induration or masses Cervix: normal appearance Adnexa: normal bimanual exam Uterus: anteverted and non-tender, normal size    Assessment:    The patient has AUB  PALM-COEIN classification: llikely O, possible P   Plan:    All questions answered. Neurosurgeon distributed. Pelvic ultrasound. keep a menstrual calendar    Meds ordered this encounter  Medications  . valsartan-hydrochlorothiazide (DIOVAN-HCT) 160-25 MG per  tablet    Sig:   . fluconazole (DIFLUCAN) 150 MG tablet    Sig: Take 1 tablet (150 mg total) by mouth once. Every other day.    Dispense:  2 tablet    Refill:  0  . progesterone (PROMETRIUM) 200 MG capsule    Sig: Take 1 capsule (200 mg total) by mouth daily. For 12 days    Dispense:  36 capsule    Refill:  1   Orders Placed This Encounter  Procedures  . WET PREP BY MOLECULAR PROBE  . GC/Chlamydia Probe Amp  . US Pelvis Complete    Standing Status: Future     Number of Occurrences:      Standing Expiration Date: 10/10/2015    Order Specific Question:  Reason for Exam (SYMPTOM  OR DIAGNOSIS REQUIRED)    Answer:  abnormal uterine bleeding    Order Specific Question:  Preferred imaging location?    Answer[facility]  . US Transvaginal Non-OB    Standing Status: Future     Number of Occurrences:      Standing Expiration Date: 10/10/2015    Order Specific Question:  Reason for Exam (SYMPTOM  OR DIAGNOSIS REQUIRED)    Answer:  abnormal uterine bleeding    Order Specific Question:  Preferred imaging location?    Answer:  [facility]  . CBC  . TSH  . Prolactin    Possible management options include: cyclic progesterone Follow up as needed.

## 2014-08-09 NOTE — Telephone Encounter (Signed)
told patient about appt on 08/23/14 - at 3:15 wh/ [place]

## 2014-08-10 LAB — WET PREP BY MOLECULAR PROBE
Candida species: NEGATIVE
Gardnerella vaginalis: NEGATIVE
TRICHOMONAS VAG: NEGATIVE

## 2014-08-10 LAB — GC/CHLAMYDIA PROBE AMP
CT Probe RNA: NEGATIVE
GC Probe RNA: NEGATIVE

## 2014-08-10 LAB — TSH: TSH: 1.727 u[IU]/mL (ref 0.350–4.500)

## 2014-08-10 LAB — PROLACTIN: Prolactin: 6.3 ng/mL

## 2014-08-13 NOTE — Patient Instructions (Signed)
Abnormal Uterine Bleeding Abnormal uterine bleeding can affect women at various stages in life, including teenagers, women in their reproductive years, pregnant women, and women who have reached menopause. Several kinds of uterine bleeding are considered abnormal, including:  Bleeding or spotting between periods.   Bleeding after sexual intercourse.   Bleeding that is heavier or more than normal.   Periods that last longer than usual.  Bleeding after menopause.  Many cases of abnormal uterine bleeding are minor and simple to treat, while others are more serious. Any type of abnormal bleeding should be evaluated by your health care provider. Treatment will depend on the cause of the bleeding. HOME CARE INSTRUCTIONS Monitor your condition for any changes. The following actions may help to alleviate any discomfort you are experiencing:  Avoid the use of tampons and douches as directed by your health care provider.  Change your pads frequently. You should get regular pelvic exams and Pap tests. Keep all follow-up appointments for diagnostic tests as directed by your health care provider.  SEEK MEDICAL CARE IF:   Your bleeding lasts more than 1 week.   You feel dizzy at times.  SEEK IMMEDIATE MEDICAL CARE IF:   You pass out.   You are changing pads every 15 to 30 minutes.   You have abdominal pain.  You have a fever.   You become sweaty or weak.   You are passing large blood clots from the vagina.   You start to feel nauseous and vomit. MAKE SURE YOU:   Understand these instructions.  Will watch your condition.  Will get help right away if you are not doing well or get worse. Document Released: 11/30/2005 Document Revised: 12/05/2013 Document Reviewed: 06/29/2013 [facility] Patient Information 2015 [place], [place]. This information is not intended to replace advice given to you by your health care provider. Make sure you discuss any questions you have with your  health care provider.

## 2014-08-23 ENCOUNTER — Ambulatory Visit (HOSPITAL_COMMUNITY)
Admission: RE | Admit: 2014-08-23 | Discharge: 2014-08-23 | Disposition: A | Discharge status: Home / Self Care | Payer: BC Managed Care – PPO | Source: Ambulatory Visit | Attending: Obstetrics & Gynecology | Admitting: Obstetrics & Gynecology

## 2014-08-23 DIAGNOSIS — N949 Unspecified condition associated with female genital organs and menstrual cycle: Secondary | ICD-10-CM | POA: Insufficient documentation

## 2014-08-23 DIAGNOSIS — N939 Abnormal uterine and vaginal bleeding, unspecified: Secondary | ICD-10-CM

## 2014-08-23 DIAGNOSIS — N938 Other specified abnormal uterine and vaginal bleeding: Secondary | ICD-10-CM | POA: Insufficient documentation

## 2014-09-14 ENCOUNTER — Telehealth: Payer: Self-pay | Admitting: *Deleted

## 2014-09-14 NOTE — Telephone Encounter (Signed)
Pt placed call to office regarding progesterone use.  Pt state that she had started on progesterone 259m in September.  Pt states that she felt fine while taking and has done well.  Pt states that now that a new month has started and she has started on medication again for this month, she has not felt well.  Pt states that she has been dizzy, faint feeling and overall not feeling well. Return call to pt making her aware that this medication shouldn't cause this type of reaction.  Pt was advised to check BP as she has hypertension.  Pt states that she didn't think of checking her BP she thought it might be the progesterone.  Pt advised that she may continue to take if she wishes and if she were to have the same symptoms she is to stop medication and make office aware.  Pt also advised that she may wish to not take progesterone this month and see what her menstral cycle is like.  Pt was given the option to do as she would like and follow up with office with any further concerns next week.   Pt states understanding and will follow up as needed.

## 2014-10-15 ENCOUNTER — Encounter: Payer: Self-pay | Admitting: Obstetrics & Gynecology

## 2014-10-31 ENCOUNTER — Ambulatory Visit: Payer: BC Managed Care – PPO | Admitting: Obstetrics & Gynecology

## 2014-11-12 ENCOUNTER — Ambulatory Visit: Payer: BC Managed Care – PPO | Admitting: Obstetrics & Gynecology

## 2014-12-10 ENCOUNTER — Encounter: Payer: Self-pay | Admitting: *Deleted

## 2014-12-11 ENCOUNTER — Encounter: Payer: Self-pay | Admitting: Obstetrics & Gynecology

## 2015-01-01 ENCOUNTER — Telehealth: Payer: Self-pay | Admitting: *Deleted

## 2015-01-01 DIAGNOSIS — B373 Candidiasis of vulva and vagina: Secondary | ICD-10-CM

## 2015-01-01 DIAGNOSIS — B3731 Acute candidiasis of vulva and vagina: Secondary | ICD-10-CM

## 2015-01-01 MED ORDER — TERCONAZOLE 0.4 % VA CREA: 1.0000 | TOPICAL_CREAM | Freq: Every day | VAGINAL | Status: DC

## 2015-01-01 NOTE — Telephone Encounter (Signed)
Patient states she is having terrible vaginal itching and irritation. Patient states it burns when urine pass over the skin. Patient states she also believes she may have hemorrhoids. Patient advised of OTC meds for hemorrhoids. Per nursing protocol prescription for Terazol 7 sent to the pharmacy. Patient advised to contact the office if symptoms persist after treatment. Patient verbalized understanding.

## 2015-04-24 ENCOUNTER — Other Ambulatory Visit: Payer: Self-pay | Admitting: Certified Nurse Midwife

## 2015-04-24 ENCOUNTER — Ambulatory Visit (INDEPENDENT_AMBULATORY_CARE_PROVIDER_SITE_OTHER): Payer: BC Managed Care – PPO | Admitting: Certified Nurse Midwife

## 2015-04-24 ENCOUNTER — Encounter: Payer: Self-pay | Admitting: Certified Nurse Midwife

## 2015-04-24 VITALS — BP 158/99 | HR 74 | Temp 98.5°F | Ht 64.0 in | Wt 211.0 lb

## 2015-04-24 DIAGNOSIS — L309 Dermatitis, unspecified: Secondary | ICD-10-CM | POA: Diagnosis not present

## 2015-04-24 DIAGNOSIS — L280 Lichen simplex chronicus: Secondary | ICD-10-CM | POA: Diagnosis not present

## 2015-04-24 DIAGNOSIS — N898 Other specified noninflammatory disorders of vagina: Secondary | ICD-10-CM | POA: Diagnosis not present

## 2015-04-24 MED ORDER — CLOBETASOL PROPIONATE 0.05 % EX OINT: 1.0000 "application " | TOPICAL_OINTMENT | Freq: Two times a day (BID) | CUTANEOUS | Status: DC

## 2015-04-24 NOTE — Addendum Note (Signed)
Addended by: Lewie Loron D on: 04/24/2015 01:06 PM   Modules accepted: Orders

## 2015-04-24 NOTE — Progress Notes (Signed)
Patient ID: Angel Phelps, female   DOB: 11-07-1976, 39 y.o.   MRN: [id number]   Chief Complaint  Patient presents with  . Vaginitis    HPI Angel Phelps is a 39 y.o. female.  C/O pruritis for over a year with occasional relief with Diflucan.  States the irritation is on the outside of the labia.  Has chronic medical conditions and is managed for her MS and HTN.   HPI  Past Medical History  Diagnosis Date  . HTN in pregnancy, chronic     Took Labetol, G3P1001 term female, no comps, miscarried, 1st trimester, failed IUD [facility]) 11/04  . Hypertension   . Obesity   . Multiple sclerosis   . Preterm labor   . Multiple sclerosis     Past Surgical History  Procedure Laterality Date  . Gravida      4/para2/miscarriged2; preeclamptic #2 pregnancy  . Spinal tap  11/27/2008  . Mri  11/18/2008    Head, abn  . Cesarean section    . Cholecystectomy    . Salpingectomy      R tube  . Dilation and curettage of uterus    . Cesarean section N/A 03/07/2014    Procedure: REPEAT CESAREAN SECTION and bilateral salpingectomy;  Surgeon: Lahoma Crocker, MD;  Location: [facility];  Service: Obstetrics;  Laterality: N/A;    Family History  Problem Relation Age of Onset  . Hypertension Mother   . Heart attack Mother 18    Massive MI  . Hypertension Father   . Hyperlipidemia Father   . Diabetes Father   . Seizures Brother   . Heart attack Maternal Grandmother     MI  . Hypertension Maternal Grandmother   . Anesthesia problems Neg Hx     Social History History  Substance Use Topics  . Smoking status: Never Smoker   . Smokeless tobacco: Never Used  . Alcohol Use: No    No Known Allergies  Current Outpatient Prescriptions  Medication Sig Dispense Refill  . amLODipine (NORVASC) 10 MG tablet Take 1 tablet (10 mg total) by mouth daily. 30 tablet 3  . valsartan-hydrochlorothiazide (DIOVAN-HCT) 160-25 MG per tablet     . clobetasol ointment (TEMOVATE) 1.21 % Apply 1 application  topically 2 (two) times daily. 30 g 0   No current facility-administered medications for this visit.    Review of Systems Review of Systems Constitutional: negative for fatigue and weight loss Respiratory: negative for cough and wheezing Cardiovascular: negative for chest pain, fatigue and palpitations Gastrointestinal: negative for abdominal pain and change in bowel habits Genitourinary:negative Integument/breast: negative for nipple discharge Musculoskeletal: +MS in remisison Neurological: negative for gait problems and tremors Behavioral/Psych: negative for abusive relationship, depression Endocrine: negative for temperature intolerance     Blood pressure 158/99, pulse 74, temperature 98.5 F (36.9 C), height 5' 4"  (1.626 m), weight 95.709 kg (211 lb), last menstrual period 04/08/2015, not currently breastfeeding.  Physical Exam Physical Exam General:   alert  Skin:   no rash or abnormalities  Lungs:   clear to auscultation bilaterally  Heart:   regular rate and rhythm, S1, S2 normal, no murmur, click, rub or gallop  Breasts:   deferred  Abdomen:  normal findings: no organomegaly, soft, non-tender and no hernia  Pelvis:  External genitalia: normal general appearance, + dermatitis, no eurhythmia does have silvery looking scales, multiple plaques seen on external labia.   Urinary system: urethral meatus normal and bladder without fullness, nontender Vaginal: normal without tenderness,  induration or masses Cervix: deferred Adnexa: deferred Uterus: deferred    50% of 30 min visit spent on counseling and coordination of care.    Data Reviewed Previous medical hx, labs, meds  Assessment     dermatitis of unknown type  Punch biopsy    Plan    No orders of the defined types were placed in this encounter.   Meds ordered this encounter  Medications  . clobetasol ointment (TEMOVATE) 0.05 %    Sig: Apply 1 application topically 2 (two) times daily.    Dispense:  30 g     Refill:  0     Possible management options include: Elicon for long term tx Follow up in 1 month.

## 2015-04-24 NOTE — Progress Notes (Signed)
Patient ID: [name], female   DOB: December 02, 1976, 39 y.o.   MRN: [id number]   Chief Complaint  Patient presents with  . Vaginitis    HPI Shakayla D Shampine is a 39 y.o. female.  C/O valvular itching for over a year.    HPI Indication: white lesion of the vulva Symptoms:   pruritic and not painful and burning  Location:  labia majora bilaterally  Physical Exam  Past Medical History  Diagnosis Date  . HTN in pregnancy, chronic     Took Labetol, G3P1001 term female, no comps, miscarried, 1st trimester, failed IUD [facility]) 11/04  . Hypertension   . Obesity   . Multiple sclerosis   . Preterm labor   . Multiple sclerosis     Past Surgical History  Procedure Laterality Date  . Gravida      4/para2/miscarriged2; preeclamptic #2 pregnancy  . Spinal tap  11/27/2008  . Mri  11/18/2008    Head, abn  . Cesarean section    . Cholecystectomy    . Salpingectomy      R tube  . Dilation and curettage of uterus    . Cesarean section N/A 03/07/2014    Procedure: REPEAT CESAREAN SECTION and bilateral salpingectomy;  Surgeon: Lahoma Crocker, MD;  Location: [place] [facility];  Service: Obstetrics;  Laterality: N/A;    Family History  Problem Relation Age of Onset  . Hypertension Mother   . Heart attack Mother 49    Massive MI  . Hypertension Father   . Hyperlipidemia Father   . Diabetes Father   . Seizures Brother   . Heart attack Maternal Grandmother     MI  . Hypertension Maternal Grandmother   . Anesthesia problems Neg Hx     Social History History  Substance Use Topics  . Smoking status: Never Smoker   . Smokeless tobacco: Never Used  . Alcohol Use: No    No Known Allergies  Current Outpatient Prescriptions  Medication Sig Dispense Refill  . amLODipine (NORVASC) 10 MG tablet Take 1 tablet (10 mg total) by mouth daily. 30 tablet 3  . valsartan-hydrochlorothiazide (DIOVAN-HCT) 160-25 MG per tablet     . clobetasol ointment (TEMOVATE) 0.32 % Apply 1 application topically  2 (two) times daily. 30 g 0   No current facility-administered medications for this visit.    Review of Systems Review of Systems  Blood pressure 158/99, pulse 74, temperature 98.5 F (36.9 C), height 5' 4"  (1.626 m), weight 95.709 kg (211 lb), last menstrual period 04/08/2015, not currently breastfeeding.  Physical Exam   Data Reviewed Previous medical hx, labs, medications  Assessment    Prepping with Betadine Local anesthesia with 1% Buffered Lidocaine 4  mm punch biopsy performed per protocol Silver Nitrate applied:  Yes Well tolerated  Specimen appropriately identified and sent to pathology    Plan    Follow-up:  4 weeks      Morene Crocker, CNM 04/24/2015, 12:29 PM

## 2015-04-27 LAB — SURESWAB BACTERIAL VAGINOSIS/ITIS
ATOPOBIUM VAGINAE: 6.4 Log (cells/mL)
BV CATEGORY: UNDETERMINED — AB
C. PARAPSILOSIS, DNA: NOT DETECTED
C. albicans, DNA: NOT DETECTED
C. glabrata, DNA: NOT DETECTED
C. tropicalis, DNA: NOT DETECTED
Gardnerella vaginalis: 4.9 Log (cells/mL)
LACTOBACILLUS SPECIES: 7.8 Log (cells/mL)
MEGASPHAERA SPECIES: NOT DETECTED Log (cells/mL)
T. VAGINALIS RNA, QL TMA: NOT DETECTED

## 2015-04-30 ENCOUNTER — Other Ambulatory Visit: Payer: Self-pay | Admitting: Certified Nurse Midwife

## 2015-05-10 ENCOUNTER — Other Ambulatory Visit: Payer: Self-pay | Admitting: *Deleted

## 2015-05-10 DIAGNOSIS — B9689 Other specified bacterial agents as the cause of diseases classified elsewhere: Secondary | ICD-10-CM

## 2015-05-10 DIAGNOSIS — N760 Acute vaginitis: Principal | ICD-10-CM

## 2015-05-10 MED ORDER — TINIDAZOLE 500 MG PO TABS: 1000.0000 mg | ORAL_TABLET | Freq: Every day | ORAL | Status: DC

## 2015-05-10 NOTE — Progress Notes (Signed)
See lab note.

## 2015-05-28 ENCOUNTER — Ambulatory Visit (INDEPENDENT_AMBULATORY_CARE_PROVIDER_SITE_OTHER): Payer: BC Managed Care – PPO | Admitting: Certified Nurse Midwife

## 2015-05-28 VITALS — BP 144/97 | HR 80 | Temp 98.5°F | Wt 208.0 lb

## 2015-05-28 DIAGNOSIS — L309 Dermatitis, unspecified: Secondary | ICD-10-CM | POA: Diagnosis not present

## 2015-05-28 DIAGNOSIS — B373 Candidiasis of vulva and vagina: Secondary | ICD-10-CM

## 2015-05-28 DIAGNOSIS — B3731 Acute candidiasis of vulva and vagina: Secondary | ICD-10-CM

## 2015-05-28 MED ORDER — MOMETASONE FUROATE 0.1 % EX CREA: 1.0000 "application " | TOPICAL_CREAM | Freq: Every day | CUTANEOUS | Status: DC

## 2015-05-28 MED ORDER — CLOTRIMAZOLE 1 % EX CREA: 1.0000 "application " | TOPICAL_CREAM | Freq: Two times a day (BID) | CUTANEOUS | Status: DC

## 2015-05-28 MED ORDER — FLUCONAZOLE 100 MG PO TABS: 100.0000 mg | ORAL_TABLET | Freq: Once | ORAL | Status: DC

## 2015-05-29 ENCOUNTER — Encounter: Payer: Self-pay | Admitting: Certified Nurse Midwife

## 2015-05-29 NOTE — Progress Notes (Signed)
Patient ID: Angel Phelps, female   DOB: [birth year], 39 y.o.   MRN: [id number]   Chief Complaint  Patient presents with  . Gynecologic Exam    HPI Angel Phelps is a 39 y.o. female.  C/O   HPI  Past Medical History  Diagnosis Date  . HTN in pregnancy, chronic     Took Labetol, G3P1001 term female, no comps, miscarried, 1st trimester, failed IUD [facility]) 11/04  . Hypertension   . Obesity   . Multiple sclerosis   . Preterm labor   . Multiple sclerosis     Past Surgical History  Procedure Laterality Date  . Gravida      4/para2/miscarriged2; preeclamptic #2 pregnancy  . Spinal tap  11/27/2008  . Mri  11/18/2008    Head, abn  . Cesarean section    . Cholecystectomy    . Salpingectomy      R tube  . Dilation and curettage of uterus    . Cesarean section N/A 03/07/2014    Procedure: REPEAT CESAREAN SECTION and bilateral salpingectomy;  Surgeon: Lahoma Crocker, MD;  Location: [facility];  Service: Obstetrics;  Laterality: N/A;    Family History  Problem Relation Age of Onset  . Hypertension Mother   . Heart attack Mother 75    Massive MI  . Hypertension Father   . Hyperlipidemia Father   . Diabetes Father   . Seizures Brother   . Heart attack Maternal Grandmother     MI  . Hypertension Maternal Grandmother   . Anesthesia problems Neg Hx     Social History History  Substance Use Topics  . Smoking status: Never Smoker   . Smokeless tobacco: Never Used  . Alcohol Use: No    No Known Allergies  Current Outpatient Prescriptions  Medication Sig Dispense Refill  . amLODipine (NORVASC) 10 MG tablet Take 1 tablet (10 mg total) by mouth daily. 30 tablet 3  . clobetasol ointment (TEMOVATE) 6.07 % Apply 1 application topically 2 (two) times daily. 30 g 0  . tinidazole (TINDAMAX) 500 MG tablet Take 2 tablets (1,000 mg total) by mouth daily with breakfast. For 5 days 10 tablet 0  . valsartan-hydrochlorothiazide (DIOVAN-HCT) 160-25 MG per tablet     . clotrimazole  (LOTRIMIN) 1 % cream Apply 1 application topically 2 (two) times daily. 30 g 0  . fluconazole (DIFLUCAN) 100 MG tablet Take 1 tablet (100 mg total) by mouth once. Repeat in 48-72 hours. 3 tablet 0  . mometasone (ELOCON) 0.1 % cream Apply 1 application topically daily. 45 g 0   No current facility-administered medications for this visit.    Review of Systems Review of Systems Constitutional: negative for fatigue and weight loss Respiratory: negative for cough and wheezing Cardiovascular: negative for chest pain, fatigue and palpitations Gastrointestinal: negative for abdominal pain and change in bowel habits Genitourinary:negative Integument/breast: negative for nipple discharge Musculoskeletal:negative for myalgias Neurological: negative for gait problems and tremors Behavioral/Psych: negative for abusive relationship, depression Endocrine: negative for temperature intolerance     Blood pressure 144/97, pulse 80, temperature 98.5 F (36.9 C), weight 208 lb (94.348 kg), last menstrual period 05/27/2015, not currently breastfeeding.  Physical Exam Physical Exam General:   alert  Skin:   no rash or abnormalities  Lungs:   clear to auscultation bilaterally  Heart:   regular rate and rhythm, S1, S2 normal, no murmur, click, rub or gallop  Breasts:   deferred  Abdomen:  normal findings: no organomegaly, soft, non-tender and  no hernia  Pelvis:  External genitalia: normal general appearance Urinary system: urethral meatus normal and bladder without fullness, nontender Vaginal: normal without tenderness, induration or masses Cervix: no CMT Adnexa: normal bimanual exam Uterus: anteverted and non-tender, normal size Rectum: +white discharge in gluteal fold    50% of 15 min visit spent on counseling and coordination of care.   Data Reviewed Previous medical hx, labs, meds  Assessment     Continued resolving vulular dermatitis  gluteal fold candidiasis   Plan    Orders Placed  This Encounter  Procedures  . SureSwab Bacterial Vaginosis/itis   Meds ordered this encounter  Medications  . mometasone (ELOCON) 0.1 % cream    Sig: Apply 1 application topically daily.    Dispense:  45 g    Refill:  0  . fluconazole (DIFLUCAN) 100 MG tablet    Sig: Take 1 tablet (100 mg total) by mouth once. Repeat in 48-72 hours.    Dispense:  3 tablet    Refill:  0  . clotrimazole (LOTRIMIN) 1 % cream    Sig: Apply 1 application topically 2 (two) times daily.    Dispense:  30 g    Refill:  0     Follow up as needed or with annual exam.

## 2015-06-01 LAB — SURESWAB BACTERIAL VAGINOSIS/ITIS
Atopobium vaginae: NOT DETECTED Log (cells/mL)
C. ALBICANS, DNA: NOT DETECTED
C. GLABRATA, DNA: NOT DETECTED
C. TROPICALIS, DNA: NOT DETECTED
C. parapsilosis, DNA: NOT DETECTED
GARDNERELLA VAGINALIS: NOT DETECTED Log (cells/mL)
LACTOBACILLUS SPECIES: 7.3 Log (cells/mL)
MEGASPHAERA SPECIES: NOT DETECTED Log (cells/mL)
T. vaginalis RNA, QL TMA: NOT DETECTED

## 2015-12-17 IMAGING — US US OB FOLLOW-UP
1 series · 12 of 28 positions shown · non-contrast
Comparison: none

[Series 1: us ob follow up · 46 acquisitions, 12 frames shown]
[im 2/46]
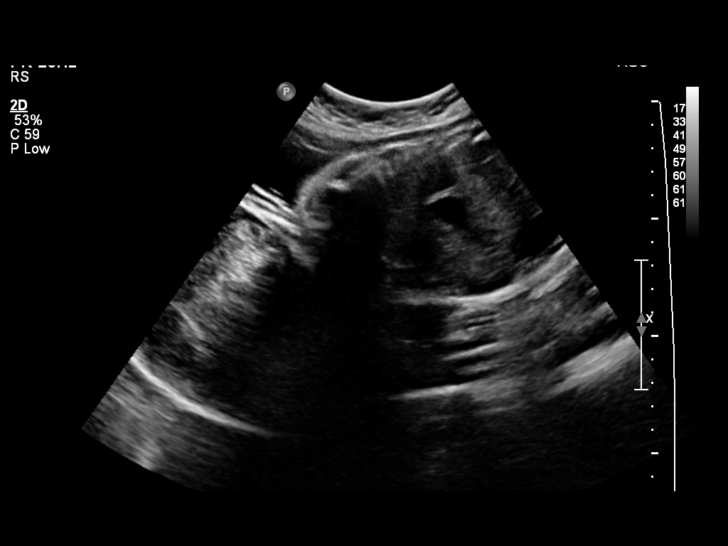
[im 6/46]
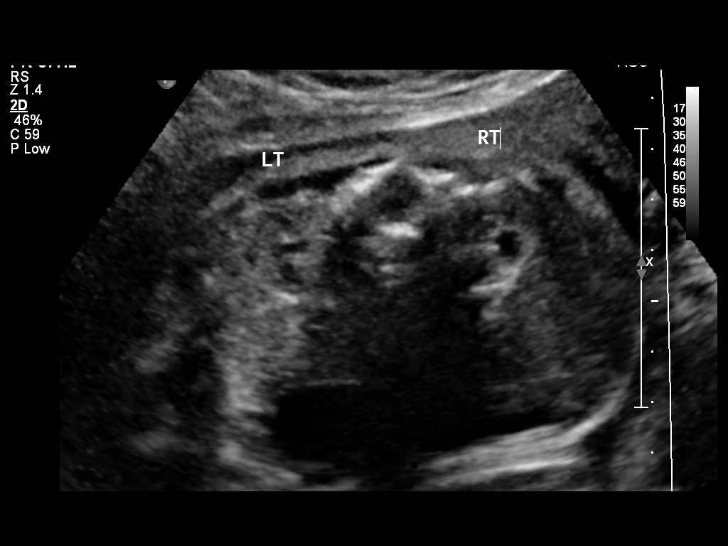
[im 9/46]
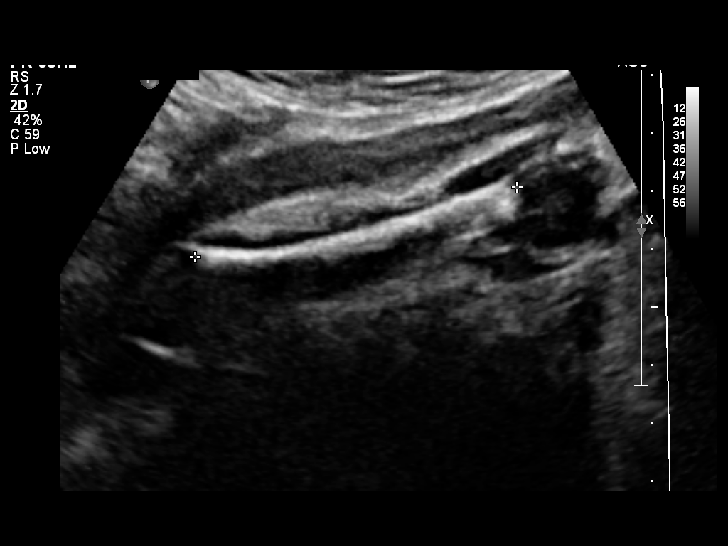
[im 14/46]
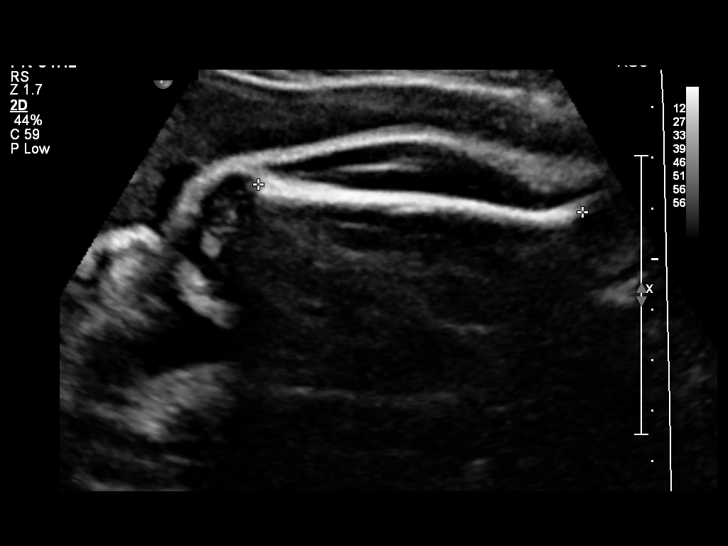
[im 17/46]
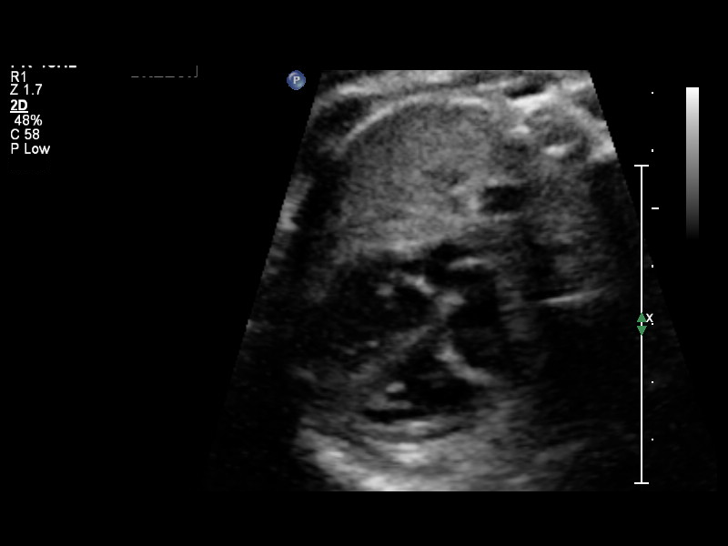
[im 21/46]
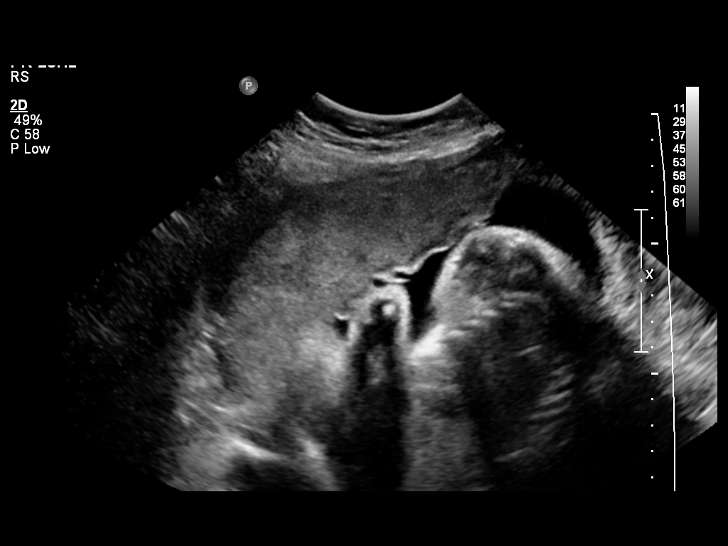
[im 26/46]
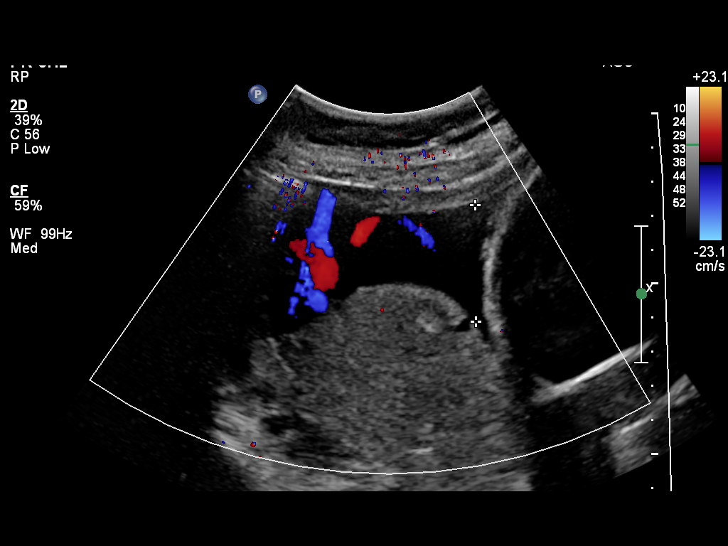
[im 29/46]
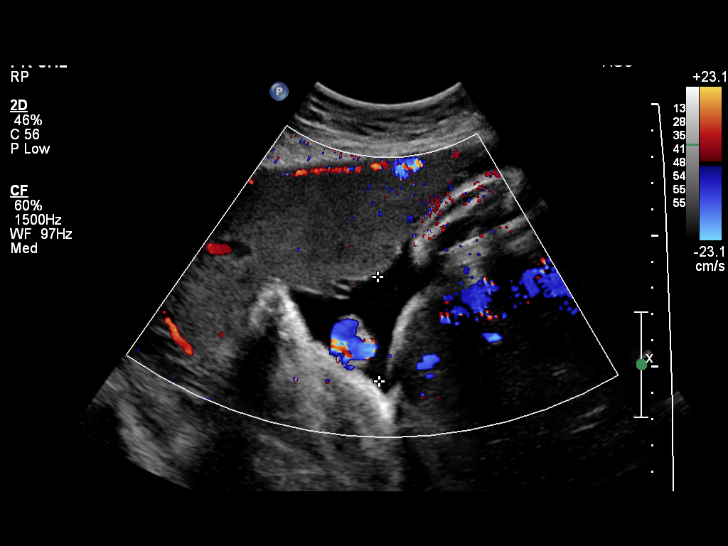
[im 32/46]
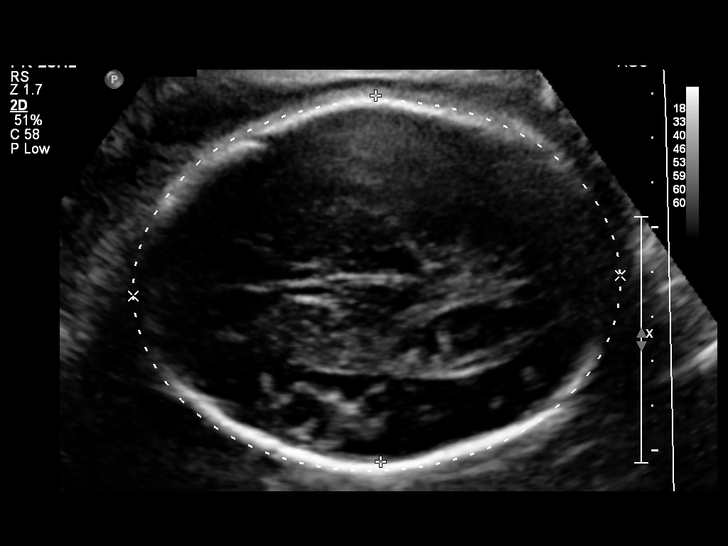
[im 37/46]
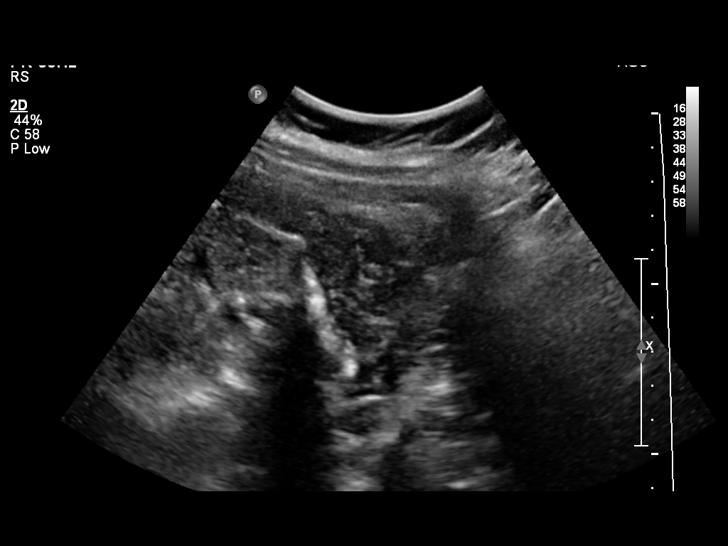
[im 41/46]
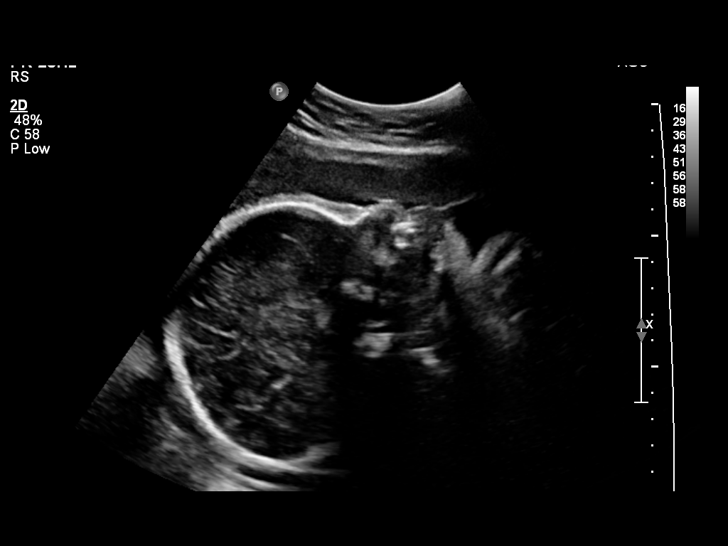
[im 44/46]
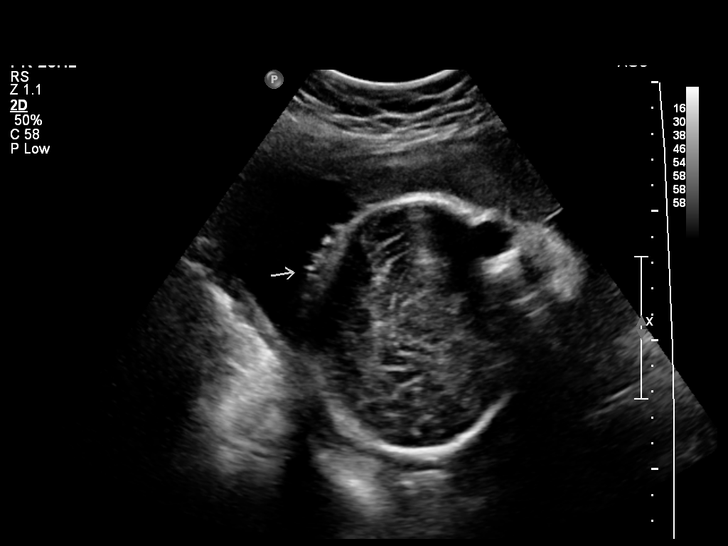

[12 of 28 positions shown; findings below may reference images not displayed]

OBSTETRICS REPORT
                      (Signed Final 02/13/2014 [DATE])

Service(s) Provided

 US OB FOLLOW UP                                       76816.1
Indications

 Hypertension - Chronic/Pre-existing (on Norvasc
 and Labetalol)
 Advanced maternal age (37), Multigravida;
 declined testing
 Poor obstetric history: Previous preterm delivery
 (29 weeks)
 Poor obstetric history: Previous preeclampsia /
 eclampsia/gestational HTN
 Previous cesarean section
 Obesity complicating pregnancy                        649.13,
Fetal Evaluation

 Num Of Fetuses:    1
 Fetal Heart Rate:  138                          bpm
 Cardiac Activity:  Observed
 Presentation:      Breech
 Placenta:          Anterior, above cervical os
 P. Cord            Previously Visualized
 Insertion:

 Amniotic Fluid
 AFI FV:      Subjectively within normal limits
 AFI Sum:     14.49   cm       52  %Tile     Larg Pckt:    3.97  cm
 RUQ:   3.41    cm   RLQ:    3.97   cm    LUQ:   3.89    cm   LLQ:    3.22   cm
Biometry

 BPD:       81  mm     G. Age:  32w 4d                CI:        72.86   70 - 86
                                                      FL/HC:      21.1   20.1 -

 HC:     301.7  mm     G. Age:  33w 3d        5  %    HC/AC:      1.05   0.93 -

 AC:       287  mm     G. Age:  32w 5d       10  %    FL/BPD:     78.8   71 - 87
 FL:      63.8  mm     G. Age:  33w 0d        9  %    FL/AC:      22.2   20 - 24
 HUM:     56.6  mm     G. Age:  32w 6d       28  %

 Est. FW:    7005  gm      4 lb 9 oz     27  %
Gestational Age
 LMP:           35w 5d        Date:  06/08/13                 EDD:   03/15/14
 U/S Today:     32w 6d                                        EDD:   04/04/14
 Best:          34w 4d     Det. By:  U/S C R L (08/01/13)     EDD:   03/23/14
Anatomy

 Cranium:          Previously seen        Aortic Arch:      Previously seen
 Fetal Cavum:      Previously seen        Ductal Arch:      Previously seen
 Ventricles:       Appears normal         Diaphragm:        Previously seen
 Choroid Plexus:   Previously seen        Stomach:          Appears normal
 Cerebellum:       Previously seen        Abdomen:          Appears normal
 Posterior Fossa:  Previously seen        Abdominal Wall:   Previously seen
 Nuchal Fold:      Not applicable (>20    Cord Vessels:     Previously seen
                   wks GA)
 Face:             Orbits and profile     Kidneys:          Appear normal
                   previously seen
 Lips:             Previously seen        Bladder:          Appears normal
 Heart:            Appears normal         Spine:            S-spine not well
                   (4CH, axis, and                          seen
                   situs)
 RVOT:             Previously seen        Lower             Previously seen
                                          Extremities:
 LVOT:             Previously seen        Upper             Previously seen
                                          Extremities:

 Other:  Fetus appears to be a female. Technically difficult due to maternal
         habitus and fetal position.
Targeted Anatomy

 Fetal Central Nervous System
 Lat. Ventricles:
Cervix Uterus Adnexa

 Cervix:       Not visualized (advanced GA >64wks)
 Uterus:       No abnormality visualized.

 Left Ovary:    Not visualized. Previously seen.
 Right Ovary:   Not visualized. Previously seen
 Adnexa:     No abnormality visualized. No adnexal mass visualized.
Impression

 IUP at 34+4 weeks
 Normal interval anatomy; anatomic survey complete
 Normal amniotic fluid volume
 Appropriate interval growth with EFW at the 27th %tile; AC at
 the 10th %tile
Recommendations

 Continue twice weekly NSTs with weekly AFIs
 Follow-up ultrasound for growth in 3 weeks

## 2016-12-03 ENCOUNTER — Ambulatory Visit (INDEPENDENT_AMBULATORY_CARE_PROVIDER_SITE_OTHER): Payer: BC Managed Care – PPO | Admitting: Obstetrics and Gynecology

## 2016-12-03 ENCOUNTER — Encounter: Payer: Self-pay | Admitting: Obstetrics and Gynecology

## 2016-12-03 DIAGNOSIS — R102 Pelvic and perineal pain: Secondary | ICD-10-CM | POA: Insufficient documentation

## 2016-12-03 DIAGNOSIS — N898 Other specified noninflammatory disorders of vagina: Secondary | ICD-10-CM | POA: Diagnosis not present

## 2016-12-03 NOTE — Progress Notes (Signed)
Patient is also having sharp pains on her left side which she thinks may be ovarian related. She has a history of ovarian cyst.

## 2016-12-03 NOTE — Progress Notes (Signed)
Patient ID: Angel Phelps, female   DOB: [birth year], 40 y.o.   MRN: [id number]  Angel Phelps presents with c/o vaginal d/c and itching. H/O yeast infection. She tried OTC meds without relief. She also reports intermittent left side pain. Sharp in nature. Denies any injury, N/V/F/C bowel or bladder dysfunction LMP first of Dec Sexual active without problems. BTL for contraception  PE AF VSS Lungs clear  Heart RRR Abd soft + BS non tender GU nl EGBUS scant white d/c uterus small mobile non tender no adnexal masses or tenderness  A/P Vaginal D/C        Pelvic Pain  NuSwab obtained today. Tx pending results Will check GYN U/S but doubt pain is GYN related F/U PRN

## 2016-12-05 ENCOUNTER — Ambulatory Visit (HOSPITAL_COMMUNITY)
Admission: EM | Admit: 2016-12-05 | Discharge: 2016-12-05 | Discharge status: Absent without leave | Payer: BC Managed Care – PPO

## 2016-12-10 ENCOUNTER — Other Ambulatory Visit: Payer: BC Managed Care – PPO

## 2016-12-12 LAB — NUSWAB VG, CANDIDA 6SP
ATOPOBIUM VAGINAE: HIGH {score} — AB
CANDIDA ALBICANS, NAA: POSITIVE — AB
CANDIDA GLABRATA, NAA: NEGATIVE
CANDIDA LUSITANIAE, NAA: NEGATIVE
CANDIDA TROPICALIS, NAA: NEGATIVE
Candida krusei, NAA: NEGATIVE
Candida parapsilosis, NAA: NEGATIVE
Trich vag by NAA: NEGATIVE

## 2016-12-18 ENCOUNTER — Other Ambulatory Visit: Payer: Self-pay

## 2016-12-18 ENCOUNTER — Ambulatory Visit (INDEPENDENT_AMBULATORY_CARE_PROVIDER_SITE_OTHER): Payer: BC Managed Care – PPO

## 2016-12-18 DIAGNOSIS — N760 Acute vaginitis: Secondary | ICD-10-CM

## 2016-12-18 DIAGNOSIS — B379 Candidiasis, unspecified: Secondary | ICD-10-CM

## 2016-12-18 DIAGNOSIS — R102 Pelvic and perineal pain: Secondary | ICD-10-CM | POA: Diagnosis not present

## 2016-12-18 DIAGNOSIS — B9689 Other specified bacterial agents as the cause of diseases classified elsewhere: Secondary | ICD-10-CM

## 2016-12-18 MED ORDER — METRONIDAZOLE 500 MG PO TABS: 500.0000 mg | ORAL_TABLET | Freq: Two times a day (BID) | ORAL | 0 refills | Status: DC

## 2016-12-18 MED ORDER — FLUCONAZOLE 150 MG PO TABS: 150.0000 mg | ORAL_TABLET | Freq: Every day | ORAL | 0 refills | Status: AC

## 2016-12-22 ENCOUNTER — Telehealth: Payer: Self-pay

## 2016-12-22 NOTE — Telephone Encounter (Signed)
-----   Message from Chancy Milroy, MD sent at 12/22/2016  8:59 AM EST ----- Please notify pt that her GYN U/S is normal. No GYN cause of her pain identified. If pain continues recommend following up with PCP. Thanks Legrand Como

## 2016-12-22 NOTE — Telephone Encounter (Signed)
Left message for patient to return call to office for results. Kathrene Alu RNBSN

## 2017-03-16 ENCOUNTER — Ambulatory Visit (INDEPENDENT_AMBULATORY_CARE_PROVIDER_SITE_OTHER): Payer: BC Managed Care – PPO | Admitting: Obstetrics

## 2017-03-16 ENCOUNTER — Encounter: Payer: Self-pay | Admitting: Obstetrics

## 2017-03-16 VITALS — BP 161/94 | HR 86 | Wt 210.9 lb

## 2017-03-16 DIAGNOSIS — K626 Ulcer of anus and rectum: Secondary | ICD-10-CM | POA: Diagnosis not present

## 2017-03-16 NOTE — Progress Notes (Signed)
Patient is in the office for herpes test. Patient stated that she was given a diagnosis by her GI doctor, and wants a second opinion

## 2017-03-17 ENCOUNTER — Encounter: Payer: Self-pay | Admitting: Obstetrics

## 2017-03-17 NOTE — Progress Notes (Signed)
Patient ID: Angel Phelps, female   DOB: [birth year], 41 y.o.   MRN: [id number]  Chief Complaint  Patient presents with  . GYN    Patient is in the office for GYN visit.    HPI Angel Phelps is a 41 y.o. female. She noticed the onset of these perianal ulcerations ~ 3 weeks ago and was evaluated by her PCP and referred to a Gastroenterologist for evaluation of hemorrhoids.  She stated that the ulceration started out with itching sensation, then became very painful.  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  There were no Herpes cultures done.  She presents today for re-examination.  She denies anal intercourse, and husband denies any penile sores. HPI  Past Medical History:  Diagnosis Date  . HTN in pregnancy, chronic    Took Labetol, G3P1001 term female, no comps, miscarried, 1st trimester, failed IUD [facility]) 11/04  . Hypertension   . Multiple sclerosis ([place])   . Multiple sclerosis ([place])   . Obesity   . Preterm labor     Past Surgical History:  Procedure Laterality Date  . CESAREAN SECTION    . CESAREAN SECTION N/A 03/07/2014   Procedure: REPEAT CESAREAN SECTION and bilateral salpingectomy;  Surgeon: Lahoma Crocker, MD;  Location: [place] [facility];  Service: Obstetrics;  Laterality: N/A;  . CHOLECYSTECTOMY    . DILATION AND CURETTAGE OF UTERUS    . Gravida     4/para2/miscarriged2; preeclamptic #2 pregnancy  . MRI  11/18/2008   Head, abn  . SALPINGECTOMY     R tube  . Spinal tap  11/27/2008    Family History  Problem Relation Age of Onset  . Hypertension Mother   . Heart attack Mother 85    Massive MI  . Hypertension Father   . Hyperlipidemia Father   . Diabetes Father   . Seizures Brother   . Heart attack Maternal Grandmother     MI  . Hypertension Maternal Grandmother   . Anesthesia problems Neg Hx     Social History Social History  Substance Use Topics  . Smoking status: Never Smoker  .  Smokeless tobacco: Never Used  . Alcohol use No    No Known Allergies  Current Outpatient Prescriptions  Medication Sig Dispense Refill  . acyclovir (ZOVIRAX) 200 MG capsule Take 200 mg by mouth 5 (five) times daily.    Marland Kitchen amLODipine (NORVASC) 10 MG tablet Take 1 tablet (10 mg total) by mouth daily. 30 tablet 3  . valsartan-hydrochlorothiazide (DIOVAN-HCT) 160-25 MG per tablet     . metroNIDAZOLE (FLAGYL) 500 MG tablet Take 1 tablet (500 mg total) by mouth 2 (two) times daily. 14 tablet 0   No current facility-administered medications for this visit.     Review of Systems Review of Systems Constitutional: negative for fatigue and weight loss Respiratory: negative for cough and wheezing Cardiovascular: negative for chest pain, fatigue and palpitations Gastrointestinal: negative for abdominal pain and change in bowel habits Genitourinary:positive for painful ulcerations around anal area Integument/breast: negative for nipple discharge Musculoskeletal:negative for myalgias Neurological: negative for gait problems and tremors Behavioral/Psych: negative for abusive relationship, depression Endocrine: negative for temperature intolerance      Blood pressure (!) 161/94, pulse 86, weight 210 lb 14.4 oz (95.7 kg), last menstrual period 03/14/2017.  Physical Exam Physical Exam General:   alert  Skin:   tender ulcerations around anal area.  Herpes cultures done.  Lungs:   clear to  auscultation bilaterally  Heart:   regular rate and rhythm, S1, S2 normal, no murmur, click, rub or gallop  Breasts:   normal without suspicious masses, skin or nipple changes or axillary nodes  Abdomen:  normal findings: no organomegaly, soft, non-tender and no hernia  Pelvis:  External genitalia: normal general appearance Urinary system: urethral meatus normal and bladder without fullness, nontender Vaginal: normal without tenderness, induration or masses Cervix: normal appearance Adnexa: normal bimanual  exam Uterus: anteverted and non-tender, normal size    50% of 15 min visit spent on counseling and coordination of care.    Data Reviewed Labs  Assessment     Perianal ulcerations.  R/O Genital Herpes.    Plan    HSV cultures sent Aerobic and anaerobic cultures sent Gram stain sent Continue Acyclovir F/U in 2 weeks  Orders Placed This Encounter  Procedures  . Herpes simplex virus culture    Order Specific Question:   Source    Answer:   Perianal ulceration  . Gram stain  . Anaerobic and Aerobic Culture    Perianal ulcerations   Meds ordered this encounter  Medications  . acyclovir (ZOVIRAX) 200 MG capsule    Sig: Take 200 mg by mouth 5 (five) times daily.

## 2017-03-19 LAB — HERPES SIMPLEX VIRUS CULTURE

## 2017-03-21 LAB — ANAEROBIC AND AEROBIC CULTURE

## 2017-03-21 LAB — GRAM STAIN

## 2017-04-01 ENCOUNTER — Ambulatory Visit: Payer: BC Managed Care – PPO | Admitting: Obstetrics

## 2017-04-20 ENCOUNTER — Other Ambulatory Visit: Payer: Self-pay | Admitting: Obstetrics

## 2017-04-20 DIAGNOSIS — B369 Superficial mycosis, unspecified: Secondary | ICD-10-CM

## 2017-04-20 DIAGNOSIS — B373 Candidiasis of vulva and vagina: Secondary | ICD-10-CM

## 2017-04-20 DIAGNOSIS — B3731 Acute candidiasis of vulva and vagina: Secondary | ICD-10-CM

## 2017-04-20 MED ORDER — TERCONAZOLE 0.8 % VA CREA: 1.0000 | TOPICAL_CREAM | Freq: Every day | VAGINAL | 0 refills | Status: DC

## 2017-04-20 MED ORDER — CLOTRIMAZOLE 1 % EX CREA: 1.0000 "application " | TOPICAL_CREAM | Freq: Two times a day (BID) | CUTANEOUS | 2 refills | Status: DC

## 2017-04-20 MED ORDER — FLUCONAZOLE 150 MG PO TABS: 150.0000 mg | ORAL_TABLET | Freq: Once | ORAL | 0 refills | Status: AC

## 2017-10-13 ENCOUNTER — Ambulatory Visit (INDEPENDENT_AMBULATORY_CARE_PROVIDER_SITE_OTHER): Payer: BC Managed Care – PPO | Admitting: Obstetrics

## 2017-10-13 ENCOUNTER — Encounter: Payer: Self-pay | Admitting: Obstetrics

## 2017-10-13 VITALS — BP 146/88 | HR 82 | Ht 64.0 in | Wt 217.2 lb

## 2017-10-13 DIAGNOSIS — N898 Other specified noninflammatory disorders of vagina: Secondary | ICD-10-CM

## 2017-10-13 DIAGNOSIS — L292 Pruritus vulvae: Secondary | ICD-10-CM

## 2017-10-13 DIAGNOSIS — B369 Superficial mycosis, unspecified: Secondary | ICD-10-CM | POA: Diagnosis not present

## 2017-10-13 MED ORDER — CLOTRIMAZOLE 1 % EX SOLN
1.0000 | Freq: Two times a day (BID) | CUTANEOUS | 1 refills | Status: AC
Start: 2017-10-13 — End: ?

## 2017-10-13 MED ORDER — FLUCONAZOLE 150 MG PO TABS: 150.0000 mg | ORAL_TABLET | Freq: Once | ORAL | 0 refills | Status: AC

## 2017-10-13 NOTE — Progress Notes (Signed)
Patient ID: Angel Phelps, female   DOB: 14-Jul-1976, 41 y.o.   MRN: [id number]  Chief Complaint  Patient presents with  . Gynecologic Exam    HPI Angel Phelps is a 41 y.o. female.  Vaginal irritation.  Itching in groin area. HPI  Past Medical History:  Diagnosis Date  . HTN in pregnancy, chronic    Took Labetol, G3P1001 term female, no comps, miscarried, 1st trimester, failed IUD [facility]) 11/04  . Hypertension   . Multiple sclerosis ([place])   . Multiple sclerosis ([place])   . Obesity   . Preterm labor     Past Surgical History:  Procedure Laterality Date  . CESAREAN SECTION    . CESAREAN SECTION N/A 03/07/2014   Procedure: REPEAT CESAREAN SECTION and bilateral salpingectomy;  Surgeon: Lahoma Crocker, MD;  Location: [place] [facility];  Service: Obstetrics;  Laterality: N/A;  . CHOLECYSTECTOMY    . DILATION AND CURETTAGE OF UTERUS    . Gravida     4/para2/miscarriged2; preeclamptic #2 pregnancy  . MRI  11/18/2008   Head, abn  . SALPINGECTOMY     R tube  . Spinal tap  11/27/2008    Family History  Problem Relation Age of Onset  . Hypertension Mother   . Heart attack Mother 9       Massive MI  . Hypertension Father   . Hyperlipidemia Father   . Diabetes Father   . Seizures Brother   . Heart attack Maternal Grandmother        MI  . Hypertension Maternal Grandmother   . Anesthesia problems Neg Hx     Social History Social History  Substance Use Topics  . Smoking status: Never Smoker  . Smokeless tobacco: Never Used  . Alcohol use No    No Known Allergies  Current Outpatient Prescriptions  Medication Sig Dispense Refill  . amLODipine (NORVASC) 10 MG tablet Take 1 tablet (10 mg total) by mouth daily. 30 tablet 3  . valsartan-hydrochlorothiazide (DIOVAN-HCT) 160-25 MG per tablet     . clotrimazole (LOTRIMIN) 1 % external solution Apply 1 application topically 2 (two) times daily. 60 mL 1  . fluconazole (DIFLUCAN) 150 MG tablet Take 1 tablet (150 mg total) by mouth  once. 1 tablet 0   No current facility-administered medications for this visit.     Review of Systems Review of Systems Constitutional: negative for fatigue and weight loss Respiratory: negative for cough and wheezing Cardiovascular: negative for chest pain, fatigue and palpitations Gastrointestinal: negative for abdominal pain and change in bowel habits Genitourinary:positive for vaginal and groin itching. Integument/breast: negative for nipple discharge Musculoskeletal:negative for myalgias Neurological: negative for gait problems and tremors Behavioral/Psych: negative for abusive relationship, depression Endocrine: negative for temperature intolerance      Blood pressure (!) 146/88, pulse 82, height 5' 4"  (1.626 m), weight 217 lb 3.2 oz (98.5 kg), last menstrual period 10/10/2017.  Physical Exam Physical Exam           General:  Alert and no distress Abdomen:  normal findings: no organomegaly, soft, non-tender and no hernia  Pelvis:  External genitalia: normal general appearance Urinary system: urethral meatus normal and bladder without fullness, nontender Vaginal: normal without tenderness, induration or masses Cervix: normal appearance Adnexa: normal bimanual exam Uterus: anteverted and non-tender, normal size    50% of 15 min visit spent on counseling and coordination of care.    Data Reviewed Wet prep  Assessment and Plan:    1. Superficial fungus infection of  skin Rx: - clotrimazole (LOTRIMIN) 1 % external solution; Apply 1 application topically 2 (two) times daily.  Dispense: 60 mL; Refill: 1  2. Vulvar pruritus Rx: - clotrimazole (LOTRIMIN) 1 % external solution; Apply 1 application topically 2 (two) times daily.  Dispense: 60 mL; Refill: 1  3. Vaginal irritation Rx: - Cervicovaginal ancillary only - fluconazole (DIFLUCAN) 150 MG tablet; Take 1 tablet (150 mg total) by mouth once.  Dispense: 1 tablet; Refill: 0    Plan    Follow up prn  No orders of  the defined types were placed in this encounter.  Meds ordered this encounter  Medications  . clotrimazole (LOTRIMIN) 1 % external solution    Sig: Apply 1 application topically 2 (two) times daily.    Dispense:  60 mL    Refill:  1  . fluconazole (DIFLUCAN) 150 MG tablet    Sig: Take 1 tablet (150 mg total) by mouth once.    Dispense:  1 tablet    Refill:  0

## 2017-10-14 ENCOUNTER — Ambulatory Visit: Payer: BC Managed Care – PPO | Admitting: Obstetrics

## 2017-10-14 LAB — CERVICOVAGINAL ANCILLARY ONLY
BACTERIAL VAGINITIS: NEGATIVE
Candida vaginitis: POSITIVE — AB

## 2017-10-18 ENCOUNTER — Other Ambulatory Visit: Payer: Self-pay | Admitting: Obstetrics

## 2017-10-18 MED ORDER — FLUCONAZOLE 150 MG PO TABS: 150.0000 mg | ORAL_TABLET | Freq: Once | ORAL | 0 refills | Status: AC

## 2017-10-29 ENCOUNTER — Other Ambulatory Visit: Payer: Self-pay | Admitting: Obstetrics

## 2017-11-01 ENCOUNTER — Other Ambulatory Visit: Payer: Self-pay | Admitting: Obstetrics

## 2017-11-01 ENCOUNTER — Telehealth: Payer: Self-pay

## 2017-11-01 DIAGNOSIS — L309 Dermatitis, unspecified: Secondary | ICD-10-CM

## 2017-11-01 MED ORDER — TRIAMCINOLONE ACETONIDE 0.5 % EX OINT: 1.0000 "application " | TOPICAL_OINTMENT | Freq: Two times a day (BID) | CUTANEOUS | 1 refills | Status: DC

## 2017-11-01 NOTE — Telephone Encounter (Signed)
Pt called stating that she was supposed to get a new rx for her vaginal itching. She states that the solution and the diflucan is not working

## 2017-11-01 NOTE — Telephone Encounter (Signed)
Pt informed

## 2017-11-01 NOTE — Telephone Encounter (Signed)
Triamcinilone ointment Rx

## 2018-03-18 ENCOUNTER — Encounter: Payer: Self-pay | Admitting: Obstetrics

## 2018-03-18 ENCOUNTER — Ambulatory Visit (INDEPENDENT_AMBULATORY_CARE_PROVIDER_SITE_OTHER): Payer: BC Managed Care – PPO | Admitting: Obstetrics

## 2018-03-18 VITALS — BP 157/101 | HR 80 | Ht 64.0 in | Wt 225.8 lb

## 2018-03-18 DIAGNOSIS — B373 Candidiasis of vulva and vagina: Secondary | ICD-10-CM | POA: Diagnosis not present

## 2018-03-18 DIAGNOSIS — B3731 Acute candidiasis of vulva and vagina: Secondary | ICD-10-CM

## 2018-03-18 MED ORDER — FLUCONAZOLE 200 MG PO TABS: 200.0000 mg | ORAL_TABLET | ORAL | 4 refills | Status: DC

## 2018-03-18 NOTE — Progress Notes (Signed)
Patient ID: Angel Phelps, female   DOB: May 22, 1976, 42 y.o.   MRN: [id number]  Chief Complaint  Patient presents with  . GYN    HPI Angel Phelps is a 42 y.o. female.  Vaginal discharge with itching. HPI  Past Medical History:  Diagnosis Date  . HTN in pregnancy, chronic    Took Labetol, G3P1001 term female, no comps, miscarried, 1st trimester, failed IUD [facility]) 11/04  . Hypertension   . Multiple sclerosis ([place])   . Multiple sclerosis ([place])   . Obesity   . Preterm labor     Past Surgical History:  Procedure Laterality Date  . CESAREAN SECTION    . CESAREAN SECTION N/A 03/07/2014   Procedure: REPEAT CESAREAN SECTION and bilateral salpingectomy;  Surgeon: Lahoma Crocker, MD;  Location: [place] [facility];  Service: Obstetrics;  Laterality: N/A;  . CHOLECYSTECTOMY    . DILATION AND CURETTAGE OF UTERUS    . Gravida     4/para2/miscarriged2; preeclamptic #2 pregnancy  . MRI  11/18/2008   Head, abn  . SALPINGECTOMY     R tube  . Spinal tap  11/27/2008    Family History  Problem Relation Age of Onset  . Hypertension Mother   . Heart attack Mother 22       Massive MI  . Hypertension Father   . Hyperlipidemia Father   . Diabetes Father   . Seizures Brother   . Heart attack Maternal Grandmother        MI  . Hypertension Maternal Grandmother   . Anesthesia problems Neg Hx     Social History Social History   Tobacco Use  . Smoking status: Never Smoker  . Smokeless tobacco: Never Used  Substance Use Topics  . Alcohol use: No    Alcohol/week: 0.0 oz  . Drug use: No    No Known Allergies  Current Outpatient Medications  Medication Sig Dispense Refill  . amLODipine (NORVASC) 10 MG tablet Take 1 tablet (10 mg total) by mouth daily. 30 tablet 3  . valsartan-hydrochlorothiazide (DIOVAN-HCT) 160-25 MG per tablet     . clotrimazole (LOTRIMIN) 1 % external solution Apply 1 application topically 2 (two) times daily. (Patient not taking: Reported on 03/18/2018) 60 mL 1  .  fluconazole (DIFLUCAN) 200 MG tablet Take 1 tablet (200 mg total) by mouth every 3 (three) days. 3 tablet 4  . triamcinolone ointment (KENALOG) 0.5 % Apply 1 application 2 (two) times daily topically. (Patient not taking: Reported on 03/18/2018) 60 g 1   No current facility-administered medications for this visit.     Review of Systems Review of Systems Constitutional: negative for fatigue and weight loss Respiratory: negative for cough and wheezing Cardiovascular: negative for chest pain, fatigue and palpitations Gastrointestinal: negative for abdominal pain and change in bowel habits Genitourinary:positive for vaginal discharge and itching Integument/breast: negative for nipple discharge Musculoskeletal:negative for myalgias Neurological: negative for gait problems and tremors Behavioral/Psych: negative for abusive relationship, depression Endocrine: negative for temperature intolerance      Blood pressure (!) 157/101, pulse 80, height 5' 4"  (1.626 m), weight 225 lb 12.8 oz (102.4 kg), last menstrual period 02/25/2018.  Physical Exam Physical Exam           General:  Alert and no distress Abdomen:  normal findings: no organomegaly, soft, non-tender and no hernia  Pelvis:  External genitalia: normal general appearance Urinary system: urethral meatus normal and bladder without fullness, nontender Vaginal: normal without tenderness, induration or masses Cervix: normal appearance  Adnexa: normal bimanual exam Uterus: anteverted and non-tender, normal size    50% of 15 min visit spent on counseling and coordination of care.   Data Reviewed Wet Prep  Assessment     1. Candida vaginitis Rx: - Cervicovaginal ancillary only - fluconazole (DIFLUCAN) 200 MG tablet; Take 1 tablet (200 mg total) by mouth every 3 (three) days.  Dispense: 3 tablet; Refill: 4    Plan    Follow up prn  No orders of the defined types were placed in this encounter.  Meds ordered this encounter   Medications  . fluconazole (DIFLUCAN) 200 MG tablet    Sig: Take 1 tablet (200 mg total) by mouth every 3 (three) days.    Dispense:  3 tablet    Refill:  4    Shelly Bombard MD 03-18-2018

## 2018-03-18 NOTE — Progress Notes (Signed)
Patient is in the office states that she has been having frequent cycles, sometimes twice a month, LMP estimated 02-25-18. Pt reports vaginal itching and irritation and discharge today.

## 2018-03-21 ENCOUNTER — Other Ambulatory Visit: Payer: Self-pay | Admitting: Obstetrics

## 2018-03-21 LAB — CERVICOVAGINAL ANCILLARY ONLY
Bacterial vaginitis: NEGATIVE
Candida vaginitis: POSITIVE — AB

## 2018-06-15 ENCOUNTER — Ambulatory Visit: Payer: BC Managed Care – PPO | Admitting: Obstetrics

## 2019-08-08 ENCOUNTER — Other Ambulatory Visit: Payer: Self-pay | Admitting: *Deleted

## 2019-08-08 DIAGNOSIS — B3731 Acute candidiasis of vulva and vagina: Secondary | ICD-10-CM

## 2019-08-08 DIAGNOSIS — B373 Candidiasis of vulva and vagina: Secondary | ICD-10-CM

## 2019-08-08 MED ORDER — FLUCONAZOLE 200 MG PO TABS: 200.0000 mg | ORAL_TABLET | ORAL | 4 refills | Status: DC

## 2019-08-08 NOTE — Progress Notes (Signed)
Pt called to office needing Rx for yeast infection. Per Dr Jodi Mourning, ok to send as previously prescribed.

## 2020-02-06 ENCOUNTER — Other Ambulatory Visit: Payer: Self-pay

## 2020-02-06 ENCOUNTER — Encounter: Payer: Self-pay | Admitting: Obstetrics

## 2020-02-06 ENCOUNTER — Ambulatory Visit: Payer: BC Managed Care – PPO | Admitting: Obstetrics

## 2020-02-06 VITALS — BP 135/87 | HR 89 | Ht 64.0 in | Wt 229.0 lb

## 2020-02-06 DIAGNOSIS — E669 Obesity, unspecified: Secondary | ICD-10-CM

## 2020-02-06 DIAGNOSIS — R102 Pelvic and perineal pain: Secondary | ICD-10-CM

## 2020-02-06 DIAGNOSIS — Z113 Encounter for screening for infections with a predominantly sexual mode of transmission: Secondary | ICD-10-CM | POA: Diagnosis not present

## 2020-02-06 DIAGNOSIS — Z1151 Encounter for screening for human papillomavirus (HPV): Secondary | ICD-10-CM | POA: Diagnosis not present

## 2020-02-06 DIAGNOSIS — Z01419 Encounter for gynecological examination (general) (routine) without abnormal findings: Secondary | ICD-10-CM

## 2020-02-06 DIAGNOSIS — Z1239 Encounter for other screening for malignant neoplasm of breast: Secondary | ICD-10-CM

## 2020-02-06 DIAGNOSIS — N939 Abnormal uterine and vaginal bleeding, unspecified: Secondary | ICD-10-CM | POA: Diagnosis not present

## 2020-02-06 DIAGNOSIS — Z124 Encounter for screening for malignant neoplasm of cervix: Secondary | ICD-10-CM | POA: Diagnosis not present

## 2020-02-06 DIAGNOSIS — N898 Other specified noninflammatory disorders of vagina: Secondary | ICD-10-CM

## 2020-02-06 DIAGNOSIS — M549 Dorsalgia, unspecified: Secondary | ICD-10-CM

## 2020-02-06 MED ORDER — METHOCARBAMOL 500 MG PO TABS: 500.0000 mg | ORAL_TABLET | Freq: Three times a day (TID) | ORAL | 2 refills | Status: AC | PRN

## 2020-02-06 MED ORDER — TINIDAZOLE 500 MG PO TABS: 1000.0000 mg | ORAL_TABLET | Freq: Every day | ORAL | 2 refills | Status: AC

## 2020-02-06 MED ORDER — CYCLOBENZAPRINE HCL 10 MG PO TABS: 10.0000 mg | ORAL_TABLET | Freq: Three times a day (TID) | ORAL | 2 refills | Status: AC | PRN

## 2020-02-06 NOTE — Progress Notes (Signed)
Subjective:        Angel Phelps is a 44 y.o. female here for a routine exam.  Current complaints: Periods twice a month.  Also had pelvic and back pain for a few days this month, and the pelvic pain subsided but the back pain still present on the left side.  Also complains of vaginal itching.  Personal health questionnaire:  Is patient Ashkenazi Jewish, have a family history of breast and/or ovarian cancer: no Is there a family history of uterine cancer diagnosed at age < 59, gastrointestinal cancer, urinary tract cancer, family member who is a Field seismologist syndrome-associated carrier: no Is the patient overweight and hypertensive, family history of diabetes, personal history of gestational diabetes, preeclampsia or PCOS: yes Is patient over 40, have PCOS,  family history of premature CHD under age 56, diabetes, smoke, have hypertension or peripheral artery disease:  yes At any time, has a partner hit, kicked or otherwise hurt or frightened you?: no Over the past 2 weeks, have you felt down, depressed or hopeless?: no Over the past 2 weeks, have you felt little interest or pleasure in doing things?:no   Gynecologic History Patient's last menstrual period was 01/28/2020. Contraception: Bilateral Salpingectomy  Last Pap: 2016. Results were: normal Last mammogram: None. Results were: None  Obstetric History OB History  Gravida Para Term Preterm AB Living  7 4 3 1 3 4   SAB TAB Ectopic Multiple Live Births  2   1   4     # Outcome Date GA Lbr Len/2nd Weight Sex Delivery Anes PTL Lv  7 Term 03/07/14 [redacted]w[redacted]d 4 lb 8.3 oz (2.05 kg) F CS-LTranv EPI  LIV  6 Term 10/13/12 [id number] / 01:18 6 lb 14.4 oz (3.13 kg) M VBAC EPI  LIV  5 Ectopic 12/14/09          4 Preterm 04/02/05 2924w0d lb (1.361 kg) M CS-LTranv  Y LIV     Birth Comments: preterm, pre eclampsia  3 SAB 12/15/03             Birth Comments: Patient had an IUD.  2 Term 06/01/03 40w62w0dlb 10 oz (3.459 kg) M Vag-Spont None   LIV     Birth Comments: high blood pressure  1 SAB 12/14/01 28w0d4w0d      Past Medical History:  Diagnosis Date  . HTN in pregnancy, chronic    Took Labetol, G3P1001 term female, no comps, miscarried, 1st trimester, failed IUD ([facility]) [facility]  . Hypertension   . Multiple sclerosis ([facility]) [place] Multiple sclerosis ([facility]) [place] Obesity   . Preterm labor     Past Surgical History:  Procedure Laterality Date  . CESAREAN SECTION    . CESAREAN SECTION N/A 03/07/2014   Procedure: REPEAT CESAREAN SECTION and bilateral salpingectomy;  Surgeon: Lisa Lahoma Crocker  [facility]  Service: Obstetrics;  Laterality: N/A;  . CHOLECYSTECTOMY    . DILATION AND CURETTAGE OF UTERUS    . Gravida     4/para2/miscarriged2; preeclamptic #2 pregnancy  . MRI  11/18/2008   Head, abn  . SALPINGECTOMY     R tube  . Spinal tap  11/27/2008     Current Outpatient Medications:  .  amLODipine (NORVASC) 10 MG tablet, Take 1 tablet (10 mg total) by mouth daily., Disp: 30 tablet, Rfl: 3 .  Multiple Vitamin (MULTIVITAMIN) tablet, Take 1 tablet by mouth daily., Disp: , Rfl:  .  valsartan-hydrochlorothiazide (DIOVAN-HCT) 160-25 MG per tablet, , Disp: , Rfl:  .  clotrimazole (LOTRIMIN) 1 % external solution, Apply 1 application topically 2 (two) times daily. (Patient not taking: Reported on 03/18/2018), Disp: 60 mL, Rfl: 1 .  cyclobenzaprine (FLEXERIL) 10 MG tablet, Take 1 tablet (10 mg total) by mouth every 8 (eight) hours as needed for muscle spasms., Disp: 30 tablet, Rfl: 2 .  fluconazole (DIFLUCAN) 200 MG tablet, Take 1 tablet (200 mg total) by mouth every 3 (three) days. (Patient not taking: Reported on 02/06/2020), Disp: 3 tablet, Rfl: 4 .  methocarbamol (ROBAXIN) 500 MG tablet, Take 1 tablet (500 mg total) by mouth every 8 (eight) hours as needed for muscle spasms., Disp: 30 tablet, Rfl: 2 .  tinidazole (TINDAMAX) 500 MG tablet, Take 2 tablets (1,000 mg total) by mouth daily with breakfast., Disp: 10 tablet, Rfl: 2 .   triamcinolone ointment (KENALOG) 0.5 %, Apply 1 application 2 (two) times daily topically. (Patient not taking: Reported on 03/18/2018), Disp: 60 g, Rfl: 1 No Known Allergies  Social History   Tobacco Use  . Smoking status: Never Smoker  . Smokeless tobacco: Never Used  Substance Use Topics  . Alcohol use: No    Alcohol/week: 0.0 standard drinks    Family History  Problem Relation Age of Onset  . Hypertension Mother   . Heart attack Mother 69       Massive MI  . Hypertension Father   . Hyperlipidemia Father   . Diabetes Father   . Seizures Brother   . Heart attack Maternal Grandmother        MI  . Hypertension Maternal Grandmother   . Anesthesia problems Neg Hx       Review of Systems  Constitutional: negative for fatigue and weight loss Respiratory: negative for cough and wheezing Cardiovascular: negative for chest pain, fatigue and palpitations Gastrointestinal: negative for abdominal pain and change in bowel habits Musculoskeletal:negative for myalgias Neurological: negative for gait problems and tremors Behavioral/Psych: negative for abusive relationship, depression Endocrine: negative for temperature intolerance    Genitourinary:negative for abnormal menstrual periods, genital lesions, hot flashes, sexual problems and vaginal discharge Integument/breast: negative for breast lump, breast tenderness, nipple discharge and skin lesion(s)    Objective:       BP 135/87   Pulse 89   Ht 5' 4"  (1.626 m)   Wt 229 lb (103.9 kg)   LMP 01/28/2020   BMI 39.31 kg/m  General:   alert  Skin:   no rash or abnormalities  Lungs:   clear to auscultation bilaterally  Heart:   regular rate and rhythm, S1, S2 normal, no murmur, click, rub or gallop  Breasts:   normal without suspicious masses, skin or nipple changes or axillary nodes  Abdomen:  normal findings: no organomegaly, soft, non-tender and no hernia  Pelvis:  External genitalia: normal general appearance Urinary system:  urethral meatus normal and bladder without fullness, nontender Vaginal: normal without tenderness, induration or masses Cervix: normal appearance Adnexa: normal bimanual exam Uterus: anteverted and non-tender, normal size   Lab Review Urine pregnancy test Labs reviewed yes Radiologic studies reviewed no  50% of 25 min visit spent on counseling and coordination of care.   Assessment:     1. Encounter for gynecological examination with Papanicolaou smear of cervix Rx: - Cytology - PAP( [place])  2. Abnormal uterine bleeding (AUB)  3. Vaginal discharge Rx: - Cervicovaginal ancillary only( [place]) - tinidazole (TINDAMAX) 500 MG tablet; Take 2  tablets (1,000 mg total) by mouth daily with breakfast.  Dispense: 10 tablet; Refill: 2  4. Pelvic pain Rx: - US PELVIC COMPLETE WITH TRANSVAGINAL; Future  5. Backache symptom Rx: - cyclobenzaprine (FLEXERIL) 10 MG tablet; Take 1 tablet (10 mg total) by mouth every 8 (eight) hours as needed for muscle spasms.  Dispense: 30 tablet; Refill: 2 - methocarbamol (ROBAXIN) 500 MG tablet; Take 1 tablet (500 mg total) by mouth every 8 (eight) hours as needed for muscle spasms.  Dispense: 30 tablet; Refill: 2  6. Screening breast examination Rx: - MM Digital Screening; Future  7. Obesity (BMI 35.0-39.9 without comorbidity) - program of caloric reduction, exercise and behavioral modification recommended    Plan:    Education reviewed: calcium supplements, depression evaluation, low fat, low cholesterol diet, safe sex/STD prevention, self breast exams and weight bearing exercise. Mammogram ordered. Follow up in: 2 weeks.   Meds ordered this encounter  Medications  . cyclobenzaprine (FLEXERIL) 10 MG tablet    Sig: Take 1 tablet (10 mg total) by mouth every 8 (eight) hours as needed for muscle spasms.    Dispense:  30 tablet    Refill:  2  . methocarbamol (ROBAXIN) 500 MG tablet    Sig: Take 1 tablet (500 mg total) by mouth every 8  (eight) hours as needed for muscle spasms.    Dispense:  30 tablet    Refill:  2  . tinidazole (TINDAMAX) 500 MG tablet    Sig: Take 2 tablets (1,000 mg total) by mouth daily with breakfast.    Dispense:  10 tablet    Refill:  2   Orders Placed This Encounter  Procedures  . US PELVIC COMPLETE WITH TRANSVAGINAL    Standing Status:   Future    Standing Expiration Date:   04/05/2021    Order Specific Question:   Reason for Exam (SYMPTOM  OR DIAGNOSIS REQUIRED)    Answer:   Pelvic pain    Order Specific Question:   Preferred imaging location?    Answer:   [facility]  . MM Digital Screening    Standing Status:   Future    Standing Expiration Date:   04/05/2021    Order Specific Question:   Reason for Exam (SYMPTOM  OR DIAGNOSIS REQUIRED)    Answer:   Screening    Order Specific Question:   Is the patient pregnant?    Answer:   No    Order Specific Question:   Preferred imaging location?    Answer:   [facility]    Shelly Bombard, MD 02/06/2020 12:26 PM

## 2020-02-06 NOTE — Progress Notes (Signed)
Patient is in the office, reports lower back pain and left sided pelvic pain when cycle was coming on. LMP 01-28-20. Pt reports vaginal itching. Last pap 2016

## 2020-02-08 LAB — CERVICOVAGINAL ANCILLARY ONLY
Bacterial Vaginitis (gardnerella): NEGATIVE
Candida Glabrata: NEGATIVE
Candida Vaginitis: NEGATIVE
Chlamydia: NEGATIVE
Comment: NEGATIVE
Comment: NEGATIVE
Comment: NEGATIVE
Comment: NEGATIVE
Comment: NEGATIVE
Comment: NORMAL
Neisseria Gonorrhea: NEGATIVE
Trichomonas: NEGATIVE

## 2020-02-08 LAB — CYTOLOGY - PAP
Comment: NEGATIVE
Diagnosis: NEGATIVE
High risk HPV: NEGATIVE

## 2020-02-12 ENCOUNTER — Other Ambulatory Visit: Payer: BC Managed Care – PPO

## 2020-02-12 ENCOUNTER — Ambulatory Visit
Admission: RE | Admit: 2020-02-12 | Discharge: 2020-02-12 | Disposition: A | Discharge status: Home / Self Care | Payer: BC Managed Care – PPO | Source: Ambulatory Visit | Attending: Obstetrics | Admitting: Obstetrics

## 2020-02-12 DIAGNOSIS — R102 Pelvic and perineal pain: Secondary | ICD-10-CM

## 2020-02-20 ENCOUNTER — Telehealth (INDEPENDENT_AMBULATORY_CARE_PROVIDER_SITE_OTHER): Payer: BC Managed Care – PPO | Admitting: Obstetrics

## 2020-02-20 ENCOUNTER — Encounter: Payer: Self-pay | Admitting: Obstetrics

## 2020-02-20 DIAGNOSIS — Z712 Person consulting for explanation of examination or test findings: Secondary | ICD-10-CM

## 2020-02-20 NOTE — Progress Notes (Signed)
TELEHEALTH VIRTUAL GYNECOLOGY VISIT ENCOUNTER NOTE  I connected with Angel Phelps on 02/20/20 at  9:45 AM EST by telephone at home and verified that I am speaking with the correct person using two identifiers.   I discussed the limitations, risks, security and privacy concerns of performing an evaluation and management service by telephone and the availability of in person appointments. I also discussed with the patient that there may be a patient responsible charge related to this service. The patient expressed understanding and agreed to proceed.   History:  Angel Phelps is a 44 y.o. [phone] female being evaluated today for discussion of ultrasound and lab results. She denies any abnormal vaginal discharge, bleeding, pelvic pain or other concerns.       Past Medical History:  Diagnosis Date  . HTN in pregnancy, chronic    Took Labetol, G3P1001 term female, no comps, miscarried, 1st trimester, failed IUD [facility]) 11/04  . Hypertension   . Multiple sclerosis ([place])   . Multiple sclerosis ([place])   . Obesity   . Preterm labor    Past Surgical History:  Procedure Laterality Date  . CESAREAN SECTION    . CESAREAN SECTION N/A 03/07/2014   Procedure: REPEAT CESAREAN SECTION and bilateral salpingectomy;  Surgeon: Lahoma Crocker, MD;  Location: [facility];  Service: Obstetrics;  Laterality: N/A;  . CHOLECYSTECTOMY    . DILATION AND CURETTAGE OF UTERUS    . Gravida     4/para2/miscarriged2; preeclamptic #2 pregnancy  . MRI  11/18/2008   Head, abn  . SALPINGECTOMY     R tube  . Spinal tap  11/27/2008   The following portions of the patient's history were reviewed and updated as appropriate: allergies, current medications, past family history, past medical history, past social history, past surgical history and problem list.   Health Maintenance:  Normal pap and negative HRHPV on 02-06-2020.    Review of Systems:  Pertinent items noted in HPI and remainder of comprehensive ROS  otherwise negative.  Physical Exam:   General:  Alert, oriented and cooperative.   Mental Status: Normal mood and affect perceived. Normal judgment and thought content.  Physical exam deferred due to nature of the encounter  Labs and Imaging Results for orders placed or performed in visit on 02/06/20 (from the past 336 hour(s))  Cytology - PAP( [place])   Collection Time: 02/06/20 11:52 AM  Result Value Ref Range   High risk HPV Negative    Adequacy      Satisfactory for evaluation; transformation zone component PRESENT.   Diagnosis      - Negative for intraepithelial lesion or malignancy (NILM)   Comment Normal Reference Range HPV - Negative   Cervicovaginal ancillary only( [place])   Collection Time: 02/06/20 11:52 AM  Result Value Ref Range   Neisseria Gonorrhea Negative    Chlamydia Negative    Trichomonas Negative    Bacterial Vaginitis (gardnerella) Negative    Candida Vaginitis Negative    Candida Glabrata Negative    Comment Normal Reference Range Candida Species - Negative    Comment Normal Reference Range Candida Galbrata - Negative    Comment Normal Reference Range Trichomonas - Negative    Comment Normal Reference [place] Chlamydia - Negative    Comment      Normal Reference Range Neisseria Gonorrhea - Negative   Comment      Normal Reference Range Bacterial Vaginosis - Negative   US PELVIC COMPLETE WITH TRANSVAGINAL  Result Date:  02/12/2020 CLINICAL DATA:  Pelvic pain. EXAM: TRANSABDOMINAL AND TRANSVAGINAL ULTRASOUND OF PELVIS TECHNIQUE: Both transabdominal and transvaginal ultrasound examinations of the pelvis were performed. Transabdominal technique was performed for global imaging of the pelvis including uterus, ovaries, adnexal regions, and pelvic cul-de-sac. It was necessary to proceed with endovaginal exam following the transabdominal exam to visualize the endometrium and ovaries. COMPARISON:  08/23/2014 FINDINGS: Uterus Measurements: 8.3 x 4.2 x 4.6 cm  = volume: 84 mL. A 9 mm fibroid is seen in the posterior corpus. Prior C-section scar noted. Endometrium Thickness: 4 mm.  No focal abnormality visualized. Right ovary Measurements: 2.2 x 2.7 x 2.5 cm = volume: 7.8 mL. Normal appearance/no adnexal mass. Left ovary Measurements: 4.1 x 2.1 x 3.7 cm = volume: 17.0 mL. Normal appearance/no adnexal mass. Other findings No abnormal free fluid. IMPRESSION: Single tiny less than 1 cm uterine fibroid. Normal appearance of both ovaries.  No adnexal mass identified. Electronically Signed   By: Marlaine Hind M.D.   On: 02/12/2020 16:00      Assessment and Plan:     1. Encounter to discuss test results - doing well - follow up in 1 year for Annual / Pap       I discussed the assessment and treatment plan with the patient. The patient was provided an opportunity to ask questions and all were answered. The patient agreed with the plan and demonstrated an understanding of the instructions.   The patient was advised to call back or seek an in-person evaluation/go to the ED if the symptoms worsen or if the condition fails to improve as anticipated.  I provided 10 minutes of non-face-to-face time during this encounter.   Baltazar Najjar, MD [facility] for [facility], [facility] 02/20/2020

## 2020-03-22 ENCOUNTER — Ambulatory Visit
Admission: RE | Admit: 2020-03-22 | Discharge: 2020-03-22 | Disposition: A | Discharge status: Home / Self Care | Payer: BC Managed Care – PPO | Source: Ambulatory Visit | Attending: Obstetrics | Admitting: Obstetrics

## 2020-03-22 ENCOUNTER — Other Ambulatory Visit: Payer: Self-pay

## 2020-03-22 DIAGNOSIS — Z1239 Encounter for other screening for malignant neoplasm of breast: Secondary | ICD-10-CM

## 2020-09-19 ENCOUNTER — Other Ambulatory Visit: Payer: Self-pay

## 2020-09-19 DIAGNOSIS — B3731 Acute candidiasis of vulva and vagina: Secondary | ICD-10-CM

## 2020-09-19 MED ORDER — FLUCONAZOLE 200 MG PO TABS: 200.0000 mg | ORAL_TABLET | ORAL | 4 refills | Status: AC

## 2020-09-19 NOTE — Telephone Encounter (Signed)
Patient is requesting a refill on diflucan. RX sent to [facility].

## 2020-12-11 ENCOUNTER — Ambulatory Visit: Payer: BC Managed Care – PPO | Admitting: Obstetrics & Gynecology

## 2020-12-17 ENCOUNTER — Other Ambulatory Visit: Payer: Self-pay

## 2020-12-17 ENCOUNTER — Ambulatory Visit (INDEPENDENT_AMBULATORY_CARE_PROVIDER_SITE_OTHER): Payer: BC Managed Care – PPO | Admitting: Obstetrics

## 2020-12-17 ENCOUNTER — Encounter: Payer: Self-pay | Admitting: Obstetrics

## 2020-12-17 ENCOUNTER — Other Ambulatory Visit (HOSPITAL_COMMUNITY)
Admission: RE | Admit: 2020-12-17 | Discharge: 2020-12-17 | Disposition: A | Discharge status: Home / Self Care | Payer: BC Managed Care – PPO | Source: Ambulatory Visit | Attending: Obstetrics | Admitting: Obstetrics

## 2020-12-17 VITALS — BP 141/89 | HR 82 | Wt 220.6 lb

## 2020-12-17 DIAGNOSIS — N898 Other specified noninflammatory disorders of vagina: Secondary | ICD-10-CM | POA: Diagnosis present

## 2020-12-17 DIAGNOSIS — R102 Pelvic and perineal pain: Secondary | ICD-10-CM

## 2020-12-17 NOTE — Progress Notes (Signed)
Patient ID: Angel Phelps, female   DOB: Apr 16, 1976, 45 y.o.   MRN: [id number]  Chief Complaint  Patient presents with  . Vaginal Pain    HPI Angel Phelps is a 45 y.o. female.  Complains of vaginal pain and discharge 2-3 days ago that is no longer present.  HPI  Past Medical History:  Diagnosis Date  . HTN in pregnancy, chronic    Took Labetol, G3P1001 term female, no comps, miscarried, 1st trimester, failed IUD [facility]) 11/04  . Hypertension   . Multiple sclerosis ([place])   . Multiple sclerosis ([place])   . Obesity   . Preterm labor     Past Surgical History:  Procedure Laterality Date  . CESAREAN SECTION    . CESAREAN SECTION N/A 03/07/2014   Procedure: REPEAT CESAREAN SECTION and bilateral salpingectomy;  Surgeon: Lahoma Crocker, MD;  Location: [facility];  Service: Obstetrics;  Laterality: N/A;  . CHOLECYSTECTOMY    . DILATION AND CURETTAGE OF UTERUS    . Gravida     4/para2/miscarriged2; preeclamptic #2 pregnancy  . MRI  11/18/2008   Head, abn  . SALPINGECTOMY     R tube  . Spinal tap  11/27/2008    Family History  Problem Relation Age of Onset  . Hypertension Mother   . Heart attack Mother 55       Massive MI  . Hypertension Father   . Hyperlipidemia Father   . Diabetes Father   . Seizures Brother   . Heart attack Maternal Grandmother        MI  . Hypertension Maternal Grandmother   . Anesthesia problems Neg Hx     Social History Social History   Tobacco Use  . Smoking status: Never Smoker  . Smokeless tobacco: Never Used  Vaping Use  . Vaping Use: Never used  Substance Use Topics  . Alcohol use: No    Alcohol/week: 0.0 standard drinks  . Drug use: No    No Known Allergies  Current Outpatient Medications  Medication Sig Dispense Refill  . amLODipine (NORVASC) 10 MG tablet Take 1 tablet (10 mg total) by mouth daily. 30 tablet 3  . Multiple Vitamin (MULTIVITAMIN) tablet Take 1 tablet by mouth daily.    . valsartan-hydrochlorothiazide  (DIOVAN-HCT) 160-25 MG per tablet     . clotrimazole (LOTRIMIN) 1 % external solution Apply 1 application topically 2 (two) times daily. (Patient not taking: Reported on 12/17/2020) 60 mL 1  . cyclobenzaprine (FLEXERIL) 10 MG tablet Take 1 tablet (10 mg total) by mouth every 8 (eight) hours as needed for muscle spasms. (Patient not taking: Reported on 12/17/2020) 30 tablet 2  . fluconazole (DIFLUCAN) 200 MG tablet Take 1 tablet (200 mg total) by mouth every 3 (three) days. (Patient not taking: Reported on 12/17/2020) 3 tablet 4  . methocarbamol (ROBAXIN) 500 MG tablet Take 1 tablet (500 mg total) by mouth every 8 (eight) hours as needed for muscle spasms. (Patient not taking: Reported on 12/17/2020) 30 tablet 2  . tinidazole (TINDAMAX) 500 MG tablet Take 2 tablets (1,000 mg total) by mouth daily with breakfast. (Patient not taking: Reported on 12/17/2020) 10 tablet 2  . triamcinolone ointment (KENALOG) 0.5 % Apply 1 application 2 (two) times daily topically. (Patient not taking: No sig reported) 60 g 1  . VITAMIN D PO      No current facility-administered medications for this visit.    Review of Systems Review of Systems Constitutional: negative for fatigue and weight loss Respiratory:  negative for cough and wheezing Cardiovascular: negative for chest pain, fatigue and palpitations Gastrointestinal: negative for abdominal pain and change in bowel habits Genitourinary: positive for vaginal pain and discharge Integument/breast: negative for nipple discharge Musculoskeletal:negative for myalgias Neurological: negative for gait problems and tremors Behavioral/Psych: negative for abusive relationship, depression Endocrine: negative for temperature intolerance      Blood pressure (!) 141/89, pulse 82, weight 220 lb 9.6 oz (100.1 kg), last menstrual period 12/08/2020.  Physical Exam Physical Exam:          General: Alert and no distress Abdomen:  normal findings: no organomegaly, soft, non-tender and no  hernia  Pelvis:  External genitalia: normal general appearance Urinary system: urethral meatus normal and bladder without fullness, nontender Vaginal: normal without tenderness, induration or masses Cervix: normal appearance Adnexa: normal bimanual exam Uterus: anteverted and non-tender, normal size    50% of 15 min visit spent on counseling and coordination of care.   Data Reviewed Wet Prep  Assessment     1. Vaginal pain - normal exam  2. Vaginal discharge Rx: - Cervicovaginal ancillary only( [place])    Plan    Follow up in 3 months  Shelly Bombard, MD 12/17/2020 9:15 AM

## 2020-12-17 NOTE — Progress Notes (Signed)
Pt c/o sharp pain in the vagina area x 1-2 days Normal pap 02/06/2020 Normal MGM 03/27/2020

## 2020-12-18 LAB — CERVICOVAGINAL ANCILLARY ONLY
Bacterial Vaginitis (gardnerella): NEGATIVE
Candida Glabrata: NEGATIVE
Candida Vaginitis: NEGATIVE
Chlamydia: NEGATIVE
Comment: NEGATIVE
Comment: NEGATIVE
Comment: NEGATIVE
Comment: NEGATIVE
Comment: NEGATIVE
Comment: NORMAL
Neisseria Gonorrhea: NEGATIVE
Trichomonas: NEGATIVE

## 2021-05-26 ENCOUNTER — Telehealth (INDEPENDENT_AMBULATORY_CARE_PROVIDER_SITE_OTHER): Payer: BC Managed Care – PPO | Admitting: Obstetrics

## 2021-05-26 ENCOUNTER — Encounter: Payer: Self-pay | Admitting: Obstetrics

## 2021-05-26 DIAGNOSIS — N939 Abnormal uterine and vaginal bleeding, unspecified: Secondary | ICD-10-CM

## 2021-05-26 DIAGNOSIS — L309 Dermatitis, unspecified: Secondary | ICD-10-CM | POA: Diagnosis not present

## 2021-05-26 DIAGNOSIS — E669 Obesity, unspecified: Secondary | ICD-10-CM | POA: Diagnosis not present

## 2021-05-26 DIAGNOSIS — R102 Pelvic and perineal pain: Secondary | ICD-10-CM

## 2021-05-26 MED ORDER — TRIAMCINOLONE ACETONIDE 0.5 % EX OINT: 1.0000 "application " | TOPICAL_OINTMENT | Freq: Two times a day (BID) | CUTANEOUS | 1 refills | Status: AC

## 2021-05-26 NOTE — Progress Notes (Signed)
GYNECOLOGY VIRTUAL VISIT ENCOUNTER NOTE  Provider location: [facility] for [place] [facility]   Patient location: Home  I connected with Angel Phelps on 05/26/21 at 11:15 AM EDT by MyChart Video Encounter and verified that I am speaking with the correct person using two identifiers.   I discussed the limitations, risks, security and privacy concerns of performing an evaluation and management service virtually and the availability of in person appointments. I also discussed with the patient that there may be a patient responsible charge related to this service. The patient expressed understanding and agreed to proceed.   History:  Angel Phelps is a 45 y.o. [phone] female being evaluated today for vaginal pain. She denies any abnormal vaginal discharge, bleeding, pelvic pain or other concerns.       Past Medical History:  Diagnosis Date   HTN in pregnancy, chronic    Took Labetol, G3P1001 term female, no comps, miscarried, 1st trimester, failed IUD [facility]) 11/04   Hypertension    Multiple sclerosis ([place])    Multiple sclerosis ([place])    Obesity    Preterm labor    Past Surgical History:  Procedure Laterality Date   CESAREAN SECTION     CESAREAN SECTION N/A 03/07/2014   Procedure: REPEAT CESAREAN SECTION and bilateral salpingectomy;  Surgeon: Lahoma Crocker, MD;  Location: [facility];  Service: Obstetrics;  Laterality: N/A;   CHOLECYSTECTOMY     DILATION AND CURETTAGE OF UTERUS     Gravida     4/para2/miscarriged2; preeclamptic #2 pregnancy   MRI  11/18/2008   Head, abn   SALPINGECTOMY     R tube   Spinal tap  11/27/2008   The following portions of the patient's history were reviewed and updated as appropriate: allergies, current medications, past family history, past medical history, past social history, past surgical history and problem list.   Health Maintenance:  Normal pap and negative HRHPV on 02-06-2020.  Normal mammogram on 03-21-2020.   Review of Systems:   Pertinent items noted in HPI and remainder of comprehensive ROS otherwise negative.  Physical Exam:   General:  Alert, oriented and cooperative. Patient appears to be in no acute distress.  Mental Status: Normal mood and affect. Normal behavior. Normal judgment and thought content.   Respiratory: Normal respiratory effort, no problems with respiration noted  Rest of physical exam deferred due to type of encounter  Labs and Imaging No results found for this or any previous visit (from the past 336 hour(s)). No results found.     Assessment and Plan:     1. Vaginal pain, resolved -will follow clinically  2. Abnormal uterine bleeding (AUB) - clinically stable  3. Obesity (BMI 35.0-39.9 without comorbidity) - weight loss with the aid of caloric reduction, exercise and behavioral modifi`cation recommended  4. Dermatitis Rx: - triamcinolone ointment (KENALOG) 0.5 %; Apply 1 application topically 2 (two) times daily.  Dispense: 60 g; Refill: 1       I discussed the assessment and treatment plan with the patient. The patient was provided an opportunity to ask questions and all were answered. The patient agreed with the plan and demonstrated an understanding of the instructions.   The patient was advised to call back or seek an in-person evaluation/go to the ED if the symptoms worsen or if the condition fails to improve as anticipated.  I have spent a total of 15 minutes of non-face-to-face time, excluding clinical staff time, reviewing notes and preparing to see patient,  ordering tests and/or medications, and counseling the patient.    Baltazar Najjar, MD [facility] for [place], [place]  Shelly Bombard, MD 05/26/2021 1:20 PM
